# Patient Record
Sex: Female | Born: 1965 | Hispanic: Yes | Marital: Married | Smoking: Never smoker
Health system: Southern US, Community
[De-identification: ages and names within clinical notes are randomized; demographics above are authoritative.]

## PROBLEM LIST (undated history)

## (undated) DIAGNOSIS — J302 Other seasonal allergic rhinitis: Secondary | ICD-10-CM

## (undated) DIAGNOSIS — E669 Obesity, unspecified: Secondary | ICD-10-CM

## (undated) DIAGNOSIS — Z9289 Personal history of other medical treatment: Secondary | ICD-10-CM

## (undated) DIAGNOSIS — J45998 Other asthma: Secondary | ICD-10-CM

## (undated) DIAGNOSIS — D649 Anemia, unspecified: Secondary | ICD-10-CM

## (undated) DIAGNOSIS — R200 Anesthesia of skin: Secondary | ICD-10-CM

## (undated) DIAGNOSIS — R202 Paresthesia of skin: Secondary | ICD-10-CM

## (undated) HISTORY — PX: TUBAL LIGATION: SHX77

## (undated) HISTORY — DX: Anemia, unspecified: D64.9

---

## 2013-07-10 ENCOUNTER — Emergency Department (HOSPITAL_COMMUNITY)
Admission: EM | Admit: 2013-07-10 | Discharge: 2013-07-11 | Disposition: A | Discharge status: Home / Self Care | Payer: Self-pay | Attending: Emergency Medicine | Admitting: Emergency Medicine

## 2013-07-10 ENCOUNTER — Emergency Department (HOSPITAL_COMMUNITY): Payer: Self-pay

## 2013-07-10 ENCOUNTER — Other Ambulatory Visit: Payer: Self-pay

## 2013-07-10 ENCOUNTER — Encounter (HOSPITAL_COMMUNITY): Payer: Self-pay | Admitting: Emergency Medicine

## 2013-07-10 DIAGNOSIS — Z79899 Other long term (current) drug therapy: Secondary | ICD-10-CM | POA: Insufficient documentation

## 2013-07-10 DIAGNOSIS — Z3202 Encounter for pregnancy test, result negative: Secondary | ICD-10-CM | POA: Insufficient documentation

## 2013-07-10 DIAGNOSIS — J45909 Unspecified asthma, uncomplicated: Secondary | ICD-10-CM

## 2013-07-10 DIAGNOSIS — D649 Anemia, unspecified: Secondary | ICD-10-CM | POA: Insufficient documentation

## 2013-07-10 DIAGNOSIS — J45901 Unspecified asthma with (acute) exacerbation: Secondary | ICD-10-CM | POA: Insufficient documentation

## 2013-07-10 LAB — BASIC METABOLIC PANEL
BUN: 18 mg/dL (ref 6–23)
CHLORIDE: 108 meq/L (ref 96–112)
CO2: 24 mEq/L (ref 19–32)
Calcium: 8.3 mg/dL — ABNORMAL LOW (ref 8.4–10.5)
Creatinine, Ser: 0.83 mg/dL (ref 0.50–1.10)
GFR calc non Af Amer: 83 mL/min — ABNORMAL LOW (ref >90)
Glucose, Bld: 106 mg/dL — ABNORMAL HIGH (ref 70–99)
POTASSIUM: 3.7 meq/L (ref 3.7–5.3)
Sodium: 143 mEq/L (ref 137–147)

## 2013-07-10 LAB — CBC
HCT: 28.2 % — ABNORMAL LOW (ref 36.0–46.0)
Hemoglobin: 7.8 g/dL — ABNORMAL LOW (ref 12.0–15.0)
MCH: 18.6 pg — ABNORMAL LOW (ref 26.0–34.0)
MCHC: 27.7 g/dL — AB (ref 30.0–36.0)
MCV: 67.1 fL — ABNORMAL LOW (ref 78.0–100.0)
PLATELETS: ADEQUATE 10*3/uL (ref 150–400)
RBC: 4.2 MIL/uL (ref 3.87–5.11)
RDW: 21 % — AB (ref 11.5–15.5)
WBC: 6.4 10*3/uL (ref 4.0–10.5)

## 2013-07-10 LAB — I-STAT TROPONIN, ED: Troponin i, poc: 0 ng/mL (ref 0.00–0.08)

## 2013-07-10 LAB — PRO B NATRIURETIC PEPTIDE: PRO B NATRI PEPTIDE: 55.7 pg/mL (ref 0–125)

## 2013-07-10 NOTE — ED Notes (Signed)
C/o pressure in center of chest with sob, dizziness, nausea, and vomiting since 2pm.  Also reports non-productive cough for the past several days.

## 2013-07-11 LAB — POC URINE PREG, ED: Preg Test, Ur: NEGATIVE

## 2013-07-11 MED ORDER — IPRATROPIUM-ALBUTEROL 0.5-2.5 (3) MG/3ML IN SOLN
3.0000 mL | Freq: Once | RESPIRATORY_TRACT | Status: AC
  Administered 2013-07-11: 3 mL via RESPIRATORY_TRACT
  Filled 2013-07-11: qty 3

## 2013-07-11 MED ORDER — ALBUTEROL SULFATE HFA 108 (90 BASE) MCG/ACT IN AERS
2.0000 | INHALATION_SPRAY | RESPIRATORY_TRACT | Status: DC | PRN
  Administered 2013-07-11: 2 via RESPIRATORY_TRACT
  Filled 2013-07-11: qty 6.7

## 2013-07-11 MED ORDER — PREDNISONE 20 MG PO TABS
60.0000 mg | ORAL_TABLET | Freq: Once | ORAL | Status: AC
  Administered 2013-07-11: 60 mg via ORAL
  Filled 2013-07-11: qty 3

## 2013-07-11 MED ORDER — FERROUS SULFATE 325 (65 FE) MG PO TABS: 325.0000 mg | ORAL_TABLET | Freq: Two times a day (BID) | ORAL | Status: DC

## 2013-07-11 MED ORDER — PREDNISONE 20 MG PO TABS: 60.0000 mg | ORAL_TABLET | Freq: Every day | ORAL | Status: DC

## 2013-07-11 NOTE — ED Notes (Signed)
Pt feels "a little bit" better after breathing treatment. Breath sounds remain diminished with wheezing but slightly improved.

## 2013-07-11 NOTE — ED Notes (Signed)
Pregnancy test negative in mini lab.

## 2013-07-11 NOTE — Discharge Instructions (Signed)
Asma - Adultos  (Asthma, Adult)  El asma es una enfermedad que afecta los pulmones. Se caracteriza por la inflamacin y Public librarian de las vas respiratorias, as como aumento de la produccin mucosa. La causa del estrechamiento es el edema y los espasmos musculares que se producen dentro de los conductos por los que pasa el aire. Cuando esto sucede, la respiracin puede ser difcil y [place] tos, sibilancias y falta de Occupational hygienist. Aprender ms sobre su enfermedad le ayudar a Art therapist. El asma no puede curarse, pero los [employer] y los cambios en el estilo de vida lo ayudarn a Therapist, sports. Puede ser un problema menor para [employer], pero si no se controla puede conducir a un ataque potencialmente mortal. El asma puede cambiar con el [place]. Es importante trabajar con su mdico para controlar sus sntomas. CAUSAS  La causa exacta es desconocida. Se cree que la causa son factores heredados (genticosy la exposicin a Secondary school teacher. En el asma hay irritacin e hinchazn (inflamacin) de las vas respiratorias. Puede originarse debido a Set designer, infecciones virales del pulmn o irritantes presentes en el aire. Las Chief of Staff pueden causar sibilancias inmediatamente o varias horas despus de una exposicin. Los desencadenantes del asma son diferentes para cada persona. Es Landscape architect atencin y saber qu lo desencadena.  Algunos desencadenantes comunes para los ataques de asma son:   La caspa que eliminan los animales de la piel, el pelo o las plumas de [place].  Los caros del polvo que se encuentran en el polvo de la casa.  [place].  El plen de los rboles o el csped.  El moho.  El humo del cigarrillo o del tabaco. NO DEBE PERMITIRSE QUE SE FUME EN LOS HOGARES DE PERSONAS CON ASMA. Las Acupuncturist no deben fumar y no deben Agricultural consultant de fumadores.  Sustancias contaminantes como el polvo, limpiadores hogareos, sprays para el cabello,  aerosoles, vapores de [place], sustancias qumicas fuertes u olores intensos.  El aire fro o los cambios climticos. El aire fro puede causar inflamacin. El viento aumenta la cantidad de moho y polen del aire. No hay ningn clima particular que sea el mejor para un asmtico.  Emociones fuertes, como llorar o reir intensamente.  Estrs.  Ciertos medicamentos como la aspirina o los betabloqueantes.  Los sulfitos que se encuentran en medicamentos y bebidas como frutas desecadas y el vino.  Enfermedades infecciosas o inflamatorias como la gripe, el resfro o una inflamacin de las membranas nasales (rinitis).  Reflujo gastroesofgico (ERGE). El ERGE es una enfermedad del estmago en el que los cidos vuelven a la garganta (esfago).  Ejercicios o actividad extenuante. Algunos medicamentos administrados antes del ejercicio permiten que la mayora de las personas puedan participar en los deportes. SNTOMAS   Falta de aire.  Opresin o dolor en el pecho.  Dificultad para dormir debido a la tos, sibilancias o falta de aire.  Un silbido o sibilancias con la espiracin.  Tos o sibilancias que empeoran cuando usted:  Tiene un virus (como un resfriado o gripe).  Sufre de Set designer.  Est expuesto a ciertos vapores o sustancias qumicas.  La prctica de ejercicios. Los signos de que su asma est empeorando son:   Signos y sntomas de asma ms frecuentes y molestos.  Aumenta la dificultad para respirar. Esto puede medirse con un medidor de flujo mximo, que es un dispositivo simple que se [place] para comprobar como estn funcionando los pulmones.  La necesidad cada vez ms frecuente de Chief Strategy Officer  de alivio rpido. DIAGNSTICO  El diagnstico se realiza revisando su historia clnica a travs de un examen fsico y con [employer]. Algunos estudios de la funcin pulmonar pueden ayudar con el diagnstico.  TRATAMIENTO  El asma no se cura. Sin embargo, en la [employer] de los  adultos puede controlarse con [place]. Adems de evitar los desencadenantes, ser necesario que use medicamentos. Hay 2 tipos de medicamento que se utilizan en el tratamiento para el asma: los medicamentos controladores (reducen la inflamacin y los sntomas) y los medicamentos de Art gallery manager o de rescate ( alivian los sntomas durante los ataques agudos). Es posible que necesite medicamentos todos los das para controlar el asma. Los medicamentos controladores ms eficaces para el asma son los corticoides inhalados (reducen la inflamacin). Otros medicamentos de control a largo plazo incluyen:   Antagonistas de los receptores de leucotrienos (bloquea una va de inflamacin).  Beta2-agonistas de accin prolongada (relajan los msculos de las vas respiratorias durante al menos 12 horas) con un corticoides inhalado.  El cromoglicato o nedocromil sdico (modifica la capacidad de ciertas clulas inflamatorias para liberar los productos qumicos que causa inflamacin).  Los inmunomoduladores (alteran el sistema inmunolgico para prevenir los sntomas del asma).  La teofilina (relaja los msculos de las vas respiratorias). Puede ser posible que necesite beta2-agonistas de corta accin para [employer] sntomas del asma durante un ataque agudo.  Usted debe saber qu hacer durante un ataque agudo. Los inhalantes son eficaces cuando se Naval architect. Lea las instrucciones para saber Clinical research associate, y hable con su mdico si tiene dudas. Concurra a los controles regularmente para asegurarse que el asma est bien controlado. Si no est bien controlado, si ha sido hospitalizado por asma o si se necesitan mltiples medicamentos o dosis medias a altas de corticoides inhalados para controlarlo, pida que lo deriven a un especialista en asma.  [place] todos los medicamentos segn le indic su mdico.  Controle el ambiente hogareo en los  siguientes modos para prevenir los ataques de asma:  Cambie el filtro de la calefaccin y del aire acondicionado al menos una vez al mes.  Coloque un filtro o estopa sobre la ventilacin de la calefaccin o del aire acondicionado.  Limite el uso de hogares o estufas a lea.  No fume. No permanezca en lugares en los que otras personas fuman.  Elimine las plagas (cucarachas, ratones) y sus excrementos.  Si observa moho en una planta, elimnela.  Limpie los pisos y elimine el polvo una vez por semana. Utilice productos sin perfume. Utilice una aspiradora con filtros HEPA, siempre que le sea posible. Si la aspiradora o las tareas de limpieza son los desencadenantes de su asma, trate de encontrar a otra persona para hacer estas tareas.  Los pisos de su casa deben ser de [place], [place] o vinilo. Las alfombras pueden retener la caspa y [place].  Use almohadas, mantas y cubre colchones antialrgicos.  [place] sbanas y [place] todas las semanas con agua caliente, y squelas con Freight forwarder.  Use mantas de polister o algodn de tejido apretado.  No use cobertores Environmental manager.  Limpie el bao y la cocina con lavandina y pntelos con pintura antihongos.  Lvese las manos con frecuencia.  Converse con el mdico acerca del plan de accin para controlar los ataques de asma. Incluye el uso de un medidor de flujo, que mide la gravedad del ataque, y medicamentos que ayuden a Architectural technologist  ataque. Un plan de accin que lo ayude a minimizar o [employer] ataques sin necesidad de Sales executive atencin mdica.  Mantenga la calma durante el ataque.  Cuente siempre con un plan para solicitar atencin mdica. Debe incluir la forma de contactar a su mdico y, en caso de un ataque grave, comunicarse con el servicio de Multimedia programmer de su localidad (911 en los [place]). SOLICITE ATENCIN MDICA SI:   Respira con dificultad an cuando toma la medicina para prevenir los ataques.  Elimina flema cada vez ms  espesa.  Su esputo cambia de un color claro o blanco a un color amarillo, verde, gris o sanguinolento.  Tiene trastornos ocasionados por la medicina que est tomando (como urticaria, picazn, hinchazn o dificultades respiratorias).  Necesita un medicamento aliviador ms de 2 o 3 veces por semana.  Su flujo mximo an est en 50-79% del Pharmacist, hospital personal despus de seguir el plan de accin durante 1 hora. SOLICITE ATENCIN MDICA DE INMEDIATO SI:   Le falta el aire, incluso en reposo.  Le falta el aire an cuando hace muy poca actividad fsica.  Tiene dificultad para comer, beber o hablar debido a los sntomas del asma.  Siente dolor en el pecho o el corazn palpita muy rpido.  Tiene los labios o las uas de tono [place].  Usted se siente mareado, dbil o se desmaya.  Tiene fiebre o sntomas que persisten durante ms de 2 o 3 das.  Tiene fiebre y los sntomas empeoran de manera sbita.  Usted parece empeorar y no responde al tratamiento durante un ataque de asma.  Su flujo mximo es Garment/textile technologist del 50% del Pharmacist, hospital personal. ASEGRESE DE QUE:   Comprende estas instrucciones.  Controlar su enfermedad.  Solicitar ayuda de inmediato si no mejora o si empeora. Document Released: 02/17/2005 Document Revised: 02/04/2012 [facility] Patient Information 2014 [place], [place].  Albuterol inhalation aerosol Qu es este medicamento? El ALBUTEROL es un broncodilatador. Ayuda a abrir las vas areas a los pulmones y Field seismologist respirar ms fcilmente. Este medicamento es utilizado para tratar y Engineer, maintenance broncoespasmo. Este medicamento puede ser utilizado para otros usos; si tiene alguna pregunta consulte con su proveedor de atencin mdica o con su farmacutico. MARCAS COMERCIALES DISPONIBLES: Proair HFA, Proventil HFA, Proventil, Respirol , Ventolin HFA, Ventolin Qu le debo informar a mi profesional de la salud antes de tomar este medicamento? Necesita saber si usted presenta  alguno de los siguientes problemas o situaciones: -diabetes -enfermedad cardiaca o pulso cardiaco irregular -alta presin sangunea -feocromocitoma -convulsiones -enfermedad tiroidea -una reaccin alrgica o inusual al albuterol, al levalbuterol, a los sulfitos, a otros medicamentos, alimentos, colorantes o conservadores -si est embarazada o buscando quedar embarazada -si est amamantando a un beb Cmo debo utilizar este medicamento? Este medicamento es para inhalacin por va oral. Siga las instrucciones de la etiqueta del [place]. Utilcelo a intervalos regulares. No utilice su medicamento con una frecuencia mayor a la indicada. Asegrese de que est utilizando su [place]. Si tiene Medtronic, comunquese con su mdico o su proveedor de Geophysical data processor. Hable con su pediatra para informarse acerca del uso de este medicamento en nios. Puede requerir atencin especial. Sobredosis: Pngase en contacto inmediatamente con un centro toxicolgico o una sala de urgencia si usted cree que haya tomado demasiado medicamento. ATENCIN: [employer] Foods es solo para usted. No comparta este medicamento con nadie. Qu sucede si me olvido de una dosis? Si olvida una dosis, sela lo antes posible. Si es casi la [employer]  de su dosis siguiente, aplique slo esa dosis. No use dosis dobles o adicionales. Qu puede interactuar con este medicamento? -antiinfecciosos como cloroquina y pentamidina -cafena -cisapride -diurticos -medicamentos para resfros -medicamentos para tratar la depresin o trastornos psicticos o emocionales -medicamentos para bajar de peso incluyendo algunos productos a base de hierbas -metadona -ciertos antibiticos Estate manager/land agent, eritromicina, levofloxacino y linezolid -ciertos medicamentos cardiacos -hormonas esteroideas, tales como dexametasona, cortisona, hidrocortisona -teofilina -hormonas tiroideas Puede ser que esta lista no menciona  todas las posibles interacciones. Informe a su profesional de KB Home	[place] de AES Corporation productos a base de hierbas, medicamentos de [place] o suplementos nutritivos que est tomando. Si usted fuma, consume bebidas alcohlicas o si utiliza drogas ilegales, indqueselo tambin a su profesional de KB Home	[place]. Algunas sustancias pueden interactuar con su medicamento. A qu debo estar atento al usar Coca-Cola? Informe a su mdico o a su profesional de la salud si sus sntomas no mejoran. No utilice albuterol adicional. Si su asma o bronquitis empeora mientras est recibiendo Coca-Cola, comunquese inmediatamente con su mdico. Si el medicamento le seca la boca trate de Engineer, manufacturing systems chicle sin azcar o chupar caramelos duros. Bebe agua como le haya indicado. Qu efectos secundarios puedo tener al Masco Corporation este medicamento? Efectos secundarios que debe informar a su mdico o a Barrister's clerk de la salud tan pronto como sea posible: -Chief of Staff como erupcin cutnea, picazn o urticarias, hinchazn de la cara, labios o lengua -problemas respiratorios -dolor en el pecho -sensacin de desmayos o mareos, cadas -alta presin sangunea -pulso cardiaco irregular -fiebre -calambres o debilidad muscular -dolor, hormigueo, entumecimiento de las manos o pies -vmito Efectos secundarios que, por lo general, no requieren atencin mdica (debe informarlos a su mdico o a su profesional de la salud si persisten o si son molestos): -tos -dificultad para conciliar el sueo -dolor de cabeza -nerviosismo, temblores -Higher education careers adviser -congestin nasal o goteo de la Lawyer -irrtacin de garganta -sabor inusual Puede ser que esta lista no menciona todos los posibles efectos secundarios. Comunquese a su mdico por asesoramiento mdico Humana Inc. Usted puede informar los efectos secundarios a la FDA por telfono al 1-800-FDA-1088. Dnde debo guardar mi medicina? Mantngala  fuera del alcance de los nios. Guarde a Levi Strauss 15 y 55 grados C (80 y 91 grados F). El contenido se encuentra bajo presin y puede explotar si lo expone al calor o al fuego. No lo congele. El fro Exelon Corporation efectividad del [place]. Deseche todo el medicamento que no haya utilizado, despus de la fecha de vencimiento. Debe desechar los inhaladores despus de que se han usado la cantidad de bocanadas indicado en la etiqueta o por la fecha de vencimiento; el que llegue primero. Debe desechar Ventolin HFA despus de 12 meses de sacar de la bolsa de aluminio. Revise las instrucciones que vienen con su medicamento. ATENCIN: Este folleto es un resumen. Puede ser que no cubra toda la posible informacin. Si usted tiene preguntas acerca de esta medicina, consulte con su mdico, su farmacutico o su profesional de Technical sales engineer.  2014, Elsevier/Gold Standard. (2012-09-14 15:41:57)  Prednisone tablets Qu es este medicamento? La PREDNISONA es un corticosteroide. Se utiliza para tratar la inflamacin de la piel, articulaciones, pulmones y otros rganos. Condiciones comunes tratadas incluyen asma, alergias y artritis. Tambin se [place] para [employer]ak, tales como trastornos sanguneos o enfermedades de la glndula suprarrenal. Este medicamento puede ser utilizado para otros usos; si tiene alguna pregunta consulte con su proveedor de  atencin mdica o con su farmacutico. MARCAS COMERCIALES DISPONIBLES: Deltasone, Predone, Sterapred DS, Sterapred Qu le debo informar a mi profesional de la salud antes de tomar este medicamento? Necesita saber si usted presenta alguno de los siguientes problemas o situaciones: -Sndrome de [place] -diabetes -glaucoma -enfermedad cardiaca -alta presin sangunea -infeccin (especialmente infecciones virales, como varicela o herpes) -enfermedad renal -enfermedad heptica -problemas mentales -miastenia  gravis -osteoporosis -convulsiones -problemas estomacales o intestinales -enfermedad tiroidea -una reaccin alrgica o inusual a la lactosa, a la prednisona, a otros medicamentos, alimentos, colorantes o conservantes -si est embarazada o buscando quedar embarazada -si est amamantando a un beb Cmo debo utilizar este medicamento? Tome este medicamento por va oral con un vaso de agua. Siga las instrucciones de la etiqueta del [place]. Tome este medicamento con alimentos. Si slo toma este medicamento una vez al da, tmelo por la [place]. No tome ms medicamento que lo indicado. No deje de tomar este medicamento de manera abrupta debido a que podr Engineer, materials reaccin severa. Su mdico le indicar la cantidad de Event organiser. Si su mdico desea que [employer], la dosis ser reducida gradualmente para Research officer, political party secundarios. Hable con su pediatra para informarse acerca del uso de este medicamento en nios. Puede requerir atencin especial. Sobredosis: Pngase en contacto inmediatamente con un centro toxicolgico o una sala de urgencia si usted cree que haya tomado demasiado medicamento. ATENCIN: ConAgra Foods es solo para usted. No comparta este medicamento con nadie. Qu sucede si me olvido de una dosis? Si olvida una dosis, tmela tan pronto como sea posible. Si es casi la hora de su dosis siguiente, consulte a su mdico o a su profesional de Technical sales engineer. Usted puede necesitar saltar una dosis o tomar una dosis adicional. No tome dosis dobles o adicionales sin asesoramiento. Qu puede interactuar con este medicamento? No tome esta medicina con ninguno de los siguientes medicamentos: -metirapona -mifepristona Esta medicina tambin puede interactuar con los siguientes medicamentos: -aminoglutetimida -anfotericina B -aspirina o medicamentos tipo aspirina -barbitricos -ciertos medicamentos para la diabetes, tales como glipizida o  gliburida -colestiramina -inhibidores de colinesterasa -ciclosporina -digoxina -diurticos -efedrina -hormonas femeninas, como estrgenos, progestinas o pldoras anticonceptivas -isoniazida -quetoconazol -los Speed, medicamentos para el dolor o inflamacin, como ibuprofeno o naproxeno -fenitona -rifampicina -toxoide -vacunas -warfarina Puede ser que esta lista no menciona todas las posibles interacciones. Informe a su profesional de KB Home	[place] de AES Corporation productos a base de hierbas, medicamentos de [place] o suplementos nutritivos que est tomando. Si usted fuma, consume bebidas alcohlicas o si utiliza drogas ilegales, indqueselo tambin a su profesional de KB Home	[place]. Algunas sustancias pueden interactuar con su medicamento. A qu debo estar atento al usar Coca-Cola? Visite a su mdico o a su profesional de la salud para chequear su evolucin peridicamente. Si toma este medicamento durante un perodo prolongado, lleve consigo una tarjeta de identificacin con su nombre y direccin, el tipo y la dosis del [place], y Academic librarian y la direccin de su mdico. [place] medicamento puede aumentar su riesgo de contraer una infeccin. Informe a su mdico o a su profesional de la salud si est en contacto con personas con sarampin o varicela, o si desarrolla llagas o ampollas que no se curan bien. Si va a someterse a una operacin, informe a su mdico o a su profesional de la salud que ha tomado este [employer] ltimos doce meses. Consulte a su mdico o a su profesional de la salud acerca de su dieta.  Tal vez tendr que reducir la cantidad de sal que consume. Este medicamento puede afectar su nivel de Dispensing optician. Si tiene diabetes, consulte a su mdico o a su profesional de la salud antes de cambiar su dieta o la dosis de su medicamento para la diabetes. Qu efectos secundarios puedo tener al Masco Corporation este medicamento? Efectos secundarios que debe informar a su mdico o  a Barrister's clerk de la salud tan pronto como sea posible: -Chief of Staff como erupcin cutnea, picazn o urticarias, hinchazn de la cara, labios o lengua -cambios de emociones o humor -cambios en la visin -humor deprimido -dolor ocular -fiebre o escalofros, tos, dolor de garganta, dolor o dificultad para orinar -aumento de sed -hinchazn de tobillos, pies Efectos secundarios que, por lo general, no requieren atencin mdica (debe informarlos a su mdico o a su profesional de la salud si persisten o si son molestos): -confusin, excitacin, inquietud -dolor de cabeza -nuseas, vmito -problemas en la piel, acn, piel delgada y brillante -dificultad para conciliar el sueo -aumento de peso Puede ser que esta lista no menciona todos los posibles efectos secundarios. Comunquese a su mdico por asesoramiento mdico Humana Inc. Usted puede informar los efectos secundarios a la FDA por telfono al 1-800-FDA-1088. Dnde debo guardar mi medicina? Mantngala fuera del alcance de los nios. Gurdela a [employer] Group, entre 15 y 43 grados C (79 y 93 grados F). Protejla de la luz. Mantenga el envase bien cerrado. Deseche los medicamentos que no haya utilizado, despus de la fecha de vencimiento. ATENCIN: Este folleto es un resumen. Puede ser que no cubra toda la posible informacin. Si usted tiene preguntas acerca de esta medicina, consulte con su mdico, su farmacutico o su profesional de Technical sales engineer.  2014, Elsevier/Gold Standard. (2010-11-06 16:57:34)  Anemia inespecfica (Anemia, Nonspecific) La anemia es una enfermedad en la que la concentracin de glbulos rojos o el nivel de hemoglobina en la sangre estn por debajo de lo normal. La hemoglobina es la sustancia de los glbulos rojos que lleva el oxgeno a todo el cuerpo. La anemia da como resultado que los tejidos no reciban la cantidad suficiente de oxgeno.  CAUSAS  Las causas ms frecuentes de anemia son:    Elvina Mattes. El sangrado puede ser interno o externo. Incluye sangrado excesivo debido al perodo (en las mujeres) o por los intestinos.   Dficit nutricional.   Enfermedad renal, tiroidea o heptica crnicas.  Enfermedades de la mdula sea que disminuyen la produccin de glbulos rojos.  Cncer y tratamientos para Science writer.  VIH, sida y sus tratamientos.  Trastornos del bazo que aumentan la destruccin de glbulos rojos.  Enfermedades de [employer].  Destruccin excesiva de glbulos rojos debido a una infeccin, a medicamentos y a Nurse, mental health. SIGNOS Y SNTOMAS   Debilidad leve.   Mareos.   Dolor de [place].  Palpitaciones.   Falta de aire, especialmente con el ejercicio.   Palidez.  Sensibilidad al fro.  Indigestin.  Nuseas.  Dificultad para dormir.  Dificultad para concentrarse. Los sntomas pueden ocurrir repentinamente o pueden Psychologist, forensic.  DIAGNSTICO  Con frecuencia es necesario realizar anlisis de [employer]. Estos ayudan al profesional a Adult nurse. Su mdico controlar la materia fecal para Hydrographic surveyor la presencia de [place] y buscar otras causas de prdida de [place].  TRATAMIENTO  El tratamiento vara segn la causa de la anemia. Las opciones de tratamiento son:   Suplementos de hierro, vitamina H84, o cido flico.   Medicamentos  con hormonas.   Transfusin de [place]. Ser necesaria en los casos de prdida de [place] grave.   Hospitalizacin. Ser necesaria si la prdida de sangre es continua y significativa.   Cambios en la dieta.  Extirpacin del bazo. INSTRUCCIONES PARA EL CUIDADO EN EL HOGAR Cumpla con todas las visitas de control. Generalmente demora varias semanas corregir la anemia, y es muy importante que el mdico controle su enfermedad y su respuesta al [place]. SOLICITE ATENCIN MDICA DE INMEDIATO SI:   Siente debilidad extrema, falta de aire o dolor en  el pecho.   Se siente mareado o tiene dificultad para concentrarse.  Tiene una hemorragia vaginal abundante.   Aparece una erupcin cutnea.   La materia fecal es negra, de aspecto alquitranado.   Se desmaya.   Vomita sangre.   Vomita repetidas veces.   Siente dolor abdominal.  Tiene fiebre o sntomas persistentes durante ms de 2 - 3 das.   Tiene fiebre y los sntomas empeoran repentinamente.   Se deshidrata.  ASEGRESE DE QUE:  Comprende estas instrucciones.  Controlar su afeccin.  Recibir ayuda de inmediato si no mejora o si empeora. Document Released: 02/17/2005 Document Revised: 10/20/2012 [facility] Patient Information 2014 [place], [place].  Iron tablets, capsules, extended-release tablets Qu es este medicamento? Las SALES DE HIERRO reemplazan al hierro que es imprescindible para los glbulos rojos saludables. El hierro se [place] para tratar la anemia por deficiencia de hierro. La anemia puede causar problemas tales como, cansancio, falta de aliento o crecimiento retardado en los nios. Slo debe tomar hierro adicional si as lo indica su mdico. No se trate usted mismo con hierro si se siente cansado. La State Farm de las personas sanas absorben cantidades [place] de hierro de su dieta, especialmente si consumen cereales fortificados, carne roja, carne de ave y pescado. Este medicamento puede ser utilizado para otros usos; si tiene alguna pregunta consulte con su proveedor de atencin mdica o con su farmacutico. MARCAS COMERCIALES DISPONIBLES: Georgann Housekeeper, Duofer, Feosol Complete, WESCO International, Feosol, [place], [place] , [place], Fero-Grad 500, Ferretts, Ferrimin , Ferro-Sequels, Hemocyte, Nephro-Fer, NovaFerrum, ProFe, Slow Fe, Slow Iron , Tandem Qu le debo informar a mi profesional de la salud antes de tomar este medicamento? Necesita saber si usted presenta alguno de los siguientes problemas o situaciones: -si consume bebidas alcohlicas con  frecuencia -enfermedad intestinal -anemia hemoltica -exceso de hierro (hemocromatosis, hemosiderosis) -enfermedad heptica -problemas para tragar -lcera gstrica u otros problemas estomacales -una reaccin alrgica o inusual al hierro, otros medicamentos, alimentos, colorantes o conservantes -si est embarazada o buscando quedar embarazada -si est amamantando a un beb Cmo debo utilizar este medicamento? Celanese Corporation medicamento por va oral con un vaso de agua o jugo de frutas. Siga las instrucciones de la etiqueta del [place]. Ingerir entero. No lo triture ni mastique. Tome este medicamento mientras est sentado o en posicin erguida. Trate de tomar las dosis que corresponden a la hora de acostarse al menos 10 minutos antes de [place]. Puede tomar este medicamento con alimentos. Tome sus dosis a intervalos regulares. No tome su medicamento con una frecuencia mayor a la indicada. No deje de tomarlo excepto si as lo indica su mdico. Hable con su pediatra para informarse acerca del uso de este medicamento en nios. Aunque este medicamento se puede recetar para condiciones selectivas, las precauciones se aplican. Sobredosis: Pngase en contacto inmediatamente con un centro toxicolgico o una sala de urgencia si usted cree que haya tomado demasiado medicamento. ATENCIN: ConAgra Foods es solo para usted. No comparta  este medicamento con nadie. Qu sucede si me olvido de una dosis? Si olvida una dosis, tmela lo antes posible. Si es casi la hora de la prxima dosis, tome slo esa dosis. No tome dosis adicionales o dobles. Qu puede interactuar con este medicamento? Si est tomando este producto con hierro no Conservator, museum/gallery o suplemento diettico que IT sales professional. Esta medicina tambin puede interactuar con los siguientes medicamentos: -alendronato -anticidos -cefdinir -cloranfenicol -colestiramina -deferoxamina -dimercaprol -etidronato -medicamentos para  lceras u otros problemas estomacales -suplementos con enzimas pancreticas -antibiticos quinolnicos (por ejemplo: Cipro, Floxin, Levaquin, Tequin y otros) -risedronato -antibiticos tetraciclnicos (por ejemplo: doxiciclina, Nature conservation officer, minociclina y otros) -hormonas tiroideas Puede ser que esta lista no menciona todas las posibles interacciones. Informe a su profesional de KB Home	[place] de [employer] Corporation productos a base de hierbas, medicamentos de [place] o suplementos nutritivos que est tomando. Si usted fuma, consume bebidas alcohlicas o si utiliza drogas ilegales, indqueselo tambin a su profesional de KB Home	[place]. Algunas sustancias pueden interactuar con su medicamento. A qu debo estar atento al usar Coca-Cola? Los suplementos con hierro slo se deben Risk manager como indicado por su profesional de la salud. Necesitar realizarse anlisis de sangre mientras est tomando [place]. Pueden necesitar entre 3 y 6 meses de terapia con hierro para tratar L-3 Communications de hierro. Las mujeres embarazadas deben seguir las indicaciones de sus mdicos acerca de la dosis y la duracin del tratamiento con hierro. No utilice el hierro durante un perodo de tiempo mayor que el indicado y no tome una dosis mayor que la recomendada. La utilizacin prolongada puede causar un exceso de hierro en el cuerpo. No tome el hierro con anticidos. Si necesita tomar un anticido, hgalo 2 horas despus de una dosis de hierro. Qu efectos secundarios puedo tener al Masco Corporation este medicamento? Efectos secundarios que debe informar a su mdico o a Barrister's clerk de la salud tan pronto como sea posible: -Chief of Staff como erupcin cutnea, picazn o urticarias, hinchazn de la cara, labios o lengua -labios, uas o palmas de las manos azules -heces de color oscuro (esto puede deberse a la decoloracin causada por el hierro, pero puede ser indicativo de un problema ms grave) -somnolencia -dolor o  dificultad al tragar -piel plida o sudorosa -convulsiones -dolor estomacal -cansancio o debilidad inusual -vmito -pulso cardiaco dbil, rpido o irregular Efectos secundarios que, por lo general, no requieren atencin mdica (debe informarlos a su mdico o a su profesional de la salud si persisten o si son molestos): -estreimiento -indigestin -nuseas o Higher education careers adviser Puede ser que esta lista no menciona todos los posibles efectos secundarios. Comunquese a su mdico por asesoramiento mdico Humana Inc. Usted puede informar los efectos secundarios a la FDA por telfono al 1-800-FDA-1088. Dnde debo guardar mi medicina? Mantngala fuera del alcance de los nios. Aun las cantidades pequeas de hierro pueden ser nocivas para los nios. Gurdela a FPL Group, entre 15 y 2 grados C (71 y 66 grados F). Mantenga el envase bien cerrado. Deseche los medicamentos que no haya utilizado, despus de la fecha de vencimiento. ATENCIN: Este folleto es un resumen. Puede ser que no cubra toda la posible informacin. Si usted tiene preguntas acerca de esta medicina, consulte con su mdico, su farmacutico o su profesional de Technical sales engineer.  2014, Elsevier/Gold Standard. (2006-02-05 15:14:00)   Emergency Department Resource Guide 1) Find a Doctor and Pay Out of Pocket Although you won't have to find out who is covered by your insurance  plan, it is a good idea to ask around and get recommendations. You will then need to call the office and see if the doctor you have chosen will accept you as a new patient and what types of options they offer for patients who are self-pay. Some doctors offer discounts or will set up payment plans for their patients who do not have insurance, but you will need to ask so you aren't surprised when you get to your appointment.  2) Contact Your Local Health Department Not all health departments have doctors that can see patients for sick visits, but  many do, so it is worth a call to see if yours does. If you don't know where your local health department is, you can check in your phone book. The CDC also has a tool to help you locate your state's health department, and many state websites also have listings of all of their local health departments.  3) Find a [facility] If your illness is not likely to be very severe or complicated, you may want to try a walk in clinic. These are popping up all over the country in pharmacies, drugstores, and shopping centers. They're usually staffed by nurse practitioners or physician assistants that have been trained to treat common illnesses and complaints. They're usually fairly quick and inexpensive. However, if you have serious medical issues or chronic medical problems, these are probably not your best option.  No Primary Care Doctor: - Call Health Connect at  [phone] - they can help you locate a primary care doctor that  accepts your insurance, provides certain services, etc. - Physician Referral Service- [phone]  Chronic Pain Problems: Organization         Address  Phone   Notes  [facility]  [phone] Patients need to be referred by their primary care doctor.   Medication Assistance: Organization         Address  Phone   Notes  [facility] Medication [facility] [address] [place], [place] [address] [phone] --Must be a resident of [facility] -- Must have NO insurance coverage whatsoever (no Medicaid/ Medicare, etc.) -- The pt. MUST have a primary care doctor that directs their care regularly and follows them in the community   MedAssist  [phone]   [employer]  [phone]    Agencies that provide inexpensive medical care: Organization         Address  Phone   Notes  [place]  [phone]   [facility]    [phone]   [facility] [place], [place] [address] [phone]   [place] [address] [place] [phone]   Planned Parenthood    [phone]   [facility]    [phone]   [place] and [place] [address], [place] Phone:  [phone], Fax:  [phone] Hours of Operation:  9 am - 6 pm, M-F.  Also accepts Medicaid/Medicare and self-pay.  [facility] for [place], [address], [place] Phone: [phone], Fax: [phone]. Hours of Operation:  8:30 am - 5:30 pm, M-F.  Also accepts Medicaid and self-pay.  [facility] [place] [address] [place] Phone: [phone]   [place], [place], [place] [phone], Ext. [phone] Mondays & Thursdays:  7-9 AM.  First 15 patients are seen on a first come, first serve basis.    [place] Providers:  Organization         Address  Phone   Notes  [facility] [address], [place] [phone] Also accepts self-pay patients.  [facility] [address], [place]  [phone]   [place], [address], [place] [phone]   [facility] [address] [place] [phone]   Lucianne Lei [address], [place]   [phone] Only accepts [place] Access [place] patients after they have their name applied to their card.   Self-Pay (no insurance) in [facility]:  Organization         Address  Phone   Notes  Sickle Cell Patients, [facility] Internal Medicine [place] [phone]   [facility] Urgent Care [place] [phone]   [facility] [place]  [place], [place], [place] [phone]   [facility]/Dr. Osei-Bonsu  [address] [place] or  [address], [place] [phone] Phone number for both [place] and [place] locations is the same.  Urgent Medical and [facility] [address] [place] [phone]   [facility] [address], [place] or [address] [phone]   [facility] [address] [place] [phone], phone; [phone], fax Sees patients 1st and 3rd Saturday of every month.  Must not qualify for public or private insurance (i.e. Medicaid, Medicare, [place] Health Choice, Veterans' Benefits)  Household income should be no more than 200% of the poverty level The clinic cannot treat you if you are pregnant or think you are pregnant  Sexually transmitted diseases are not treated at the clinic.    Dental Care: Organization         Address  Phone  Notes  [facility] Department of [facility] [place] [phone] Accepts children up to age 68 who are enrolled in [place] or [place]; pregnant women with a Medicaid card; and children who have applied for Medicaid or [place] Health Choice, but were declined, whose parents can pay a reduced fee at time of service.  [facility] Department of [facility]  [address], [place] [phone] Accepts children up to age 58 who are enrolled in [place] or [place]; pregnant women with a Medicaid card; and children who have applied for Medicaid or [place] Health Choice, but were declined, whose parents can pay a reduced fee at time of service.  [place] Adult Dental Access PROGRAM  [place] [phone] Patients are seen by appointment only. Walk-ins are not accepted. [place] will see patients 66 years of age and older. Monday - Tuesday (8am-5pm) Most Wednesdays (8:30-5pm) $30 per visit, cash only  [facility] Adult Dental Access PROGRAM  [address], [facility] [phone] Patients are seen by appointment only. Walk-ins are not accepted. [place] will see patients 73 years of age and older. One Wednesday Evening (Monthly: Volunteer Based).  $30 per visit, cash only  [place]  [phone] for adults; Children under age  4, call Graduate Pediatric Dentistry at [phone]. Children aged 43-14, please call [phone] to request a pediatric application.  Dental services are provided in all areas of dental care including fillings, crowns and bridges, complete and partial dentures, implants, gum treatment, root canals, and extractions. Preventive care is also provided. Treatment is provided to both adults and children. Patients are selected via a lottery and there is often a waiting list.   [facility] [address] [place]  [phone] [email or web address]   [facility] [address] [place], [place] [phone], Ext. [phone] Second and Fourth Thursday of each month, opens at 6:30 AM; Clinic ends at 9 AM.  Patients are seen on a first-come first-served basis, and a limited number are seen during each clinic.   [facility]  [address] Hillard Danker [place] , [place] [phone]   Eligibility Requirements You must have lived in [place], [place], or [place] counties for at least the last three months.   You cannot be eligible for state or federal sponsored Apache Corporation, including [employer], [place], or [employer].   You generally cannot be eligible for healthcare insurance through your employer.    How to apply: Eligibility screenings are held every Tuesday and Wednesday afternoon from 1:00 pm until 4:00 pm. You do not need an appointment for the interview!  [facility] [address] [place], [place]   [place]  [place] Department  [place]  [phone]    Behavioral Health Resources in the Community: Intensive Outpatient Programs Organization         Address  Phone  Notes  [place]. [address], [place], [place] [phone]   [facility] Outpatient [address] [place], [place]   ADS: Alcohol & Drug Svcs [address] [place], [place]   [place] [address],  [place], [place] or [phone]   Substance Abuse Resources Organization         Address  Phone  Notes  Alcohol and Drug Services  [phone]   [place]  [phone]   The [place]   Chinita Pester  [phone]   Residential & Outpatient Substance Abuse Program  [phone]   Psychological Services Organization         Address  Phone  Notes  [facility] [place]  [place]  [phone]   [place] [address], [place] or [phone]    Mobile Crisis Teams Organization         Address  Phone  Notes  [facility], Mobile Crisis Care Unit  [phone]   [facility]ive Psychotherapeutic Services  [address] [place], [place]   [place] [address] [place] [phone]    Self-Help/Support Groups Organization         Address  Phone             Notes  [place]. of [place] - variety of support groups  [place] Call for more information  Narcotics Anonymous (NA), [facility] [address], [employer] [place]  2 meetings at this location   Special educational needs teacher         Address  Phone  Notes  [facility] [place],  [place]  [place]  [address] [place] 017793, [place], [place]   [place], [place] [phone]  Admissions: 8am-3pm M-F  Incentives Substance [place] [address]    [place], [place] [phone]   [facility] [address] [place], [place], [place]   [facility] [address]  [place], [place]   [facility] Programs - Intensive Outpatient [address]een [address], [place], [place]   [facility] ([place].,  [place], [place] [phone] or [phone]   [facility]idential Treatment Services (RTS) [address] [place], [place] Accepts Medicaid  [facility] [place] [address],  [place] [phone] Substance Abuse/Addiction Treatment   [facility] Organization         Address  Phone  Notes  [facility]  [phone]   Domenic Schwab, PhD [address] [place]lis [place], [place]   [phone] or [phone]   [place], [place] [phone]   [facility] [address] [place], [place] [phone] Insurance/Medicaid/sponsorship through [facility] and Families [address] [place] [place]                                    [place], [place] [phone] [place] [address], [place] [phone]    Dr. Adele Schilder  [phone]   [facility] [place] Dept. 1) [address], [place] 2) [place] 3)  [place] [address], [place] [phone]  [phone]   [place] [phone] or [phone] (After Hours)

## 2013-07-11 NOTE — ED Provider Notes (Signed)
CSN: [id number]     Arrival date & time 07/10/13  2233 History   First MD Initiated Contact with Patient 07/11/13 0035     Chief Complaint  Patient presents with  . Chest Pain     (Consider location/radiation/quality/duration/timing/severity/associated sxs/prior Treatment) Patient is a 48 y.o. female presenting with chest pain. The history is provided by the patient.  Chest Pain She complains of tight feeling in her chest and dyspnea throughout the day today. Disc is worse if she lays flat her she exerts herself. There is an associated nonproductive cough and nausea when she coughs. She denies fever, chills, sweats. She denies arthralgias or myalgias. She has not done anything to try to treat or chest symptoms. She is a nonsmoker and has no history of hypertension or diabetes or hyperlipidemia. She does have history of seasonal allergies.  History reviewed. No pertinent past medical history. History reviewed. No pertinent past surgical history. No family history on file. History  Substance Use Topics  . Smoking status: Never Smoker   . Smokeless tobacco: Not on file  . Alcohol Use: No   OB History   Grav Para Term Preterm Abortions TAB SAB Ect Mult Living                 Review of Systems  Cardiovascular: Positive for chest pain.  All other systems reviewed and are negative.     Allergies  Review of patient's allergies indicates no known allergies.  Home Medications   Prior to Admission medications   Medication Sig Start Date End Date Taking? Authorizing Provider  fexofenadine (ALLEGRA) 180 MG tablet Take 180 mg by mouth daily.   Yes Historical Provider, MD   BP 125/63  Pulse 96  Temp(Src) 99.2 F (37.3 C) (Oral)  Resp 31  Ht 5\' 5"  (1.651 m)  Wt 224 lb (101.606 kg)  BMI 37.28 kg/m2  SpO2 100%  LMP 07/09/2013 Physical Exam  Nursing note and vitals reviewed.  48 year old female, resting comfortably and in no acute distress. Vital signs are significant for  tachypnea with respiratory rate of 31. Oxygen saturation is 100%, which is normal. Head is normocephalic and atraumatic. PERRLA, EOMI. Oropharynx is clear. Neck is nontender and supple without adenopathy or JVD. Back is nontender and there is no CVA tenderness. Lungs have moderate expiratory wheezes. There are no rales or rhonchi. Chest is nontender. Heart has regular rate and rhythm without murmur. Abdomen is soft, flat, nontender without masses or hepatosplenomegaly and peristalsis is normoactive. Extremities have 1+ edema, full range of motion is present. Skin is warm and dry without rash. Neurologic: Mental status is normal, cranial nerves are intact, there are no motor or sensory deficits.  ED Course  Procedures (including critical care time) Labs Review Results for orders placed during the hospital encounter of 07/10/13  CBC      Result Value Ref Range   WBC 6.4  4.0 - 10.5 K/uL   RBC 4.20  3.87 - 5.11 MIL/uL   Hemoglobin 7.8 (*) 12.0 - 15.0 g/dL   HCT 28.2 (*) 36.0 - 46.0 %   MCV 67.1 (*) 78.0 - 100.0 fL   MCH 18.6 (*) 26.0 - 34.0 pg   MCHC 27.7 (*) 30.0 - 36.0 g/dL   RDW 21.0 (*) 11.5 - 15.5 %   Platelets    150 - 400 K/uL   Value: PLATELET CLUMPS NOTED ON SMEAR, COUNT APPEARS ADEQUATE  BASIC METABOLIC PANEL      Result Value  Ref Range   Sodium 143  137 - 147 mEq/L   Potassium 3.7  3.7 - 5.3 mEq/L   [place] 108  96 - 112 mEq/L   CO2 24  19 - 32 mEq/L   Glucose, Bld 106 (*) 70 - 99 mg/dL   BUN 18  6 - 23 mg/dL   Creatinine, Ser 0.83  0.50 - 1.10 mg/dL   Calcium 8.3 (*) 8.4 - 10.5 mg/dL   GFR calc non Af Amer 83 (*) >90 mL/min   GFR calc Af Amer >90  >90 mL/min  PRO B NATRIURETIC PEPTIDE      Result Value Ref Range   Pro B Natriuretic peptide (BNP) 55.7  0 - 125 pg/mL  I-STAT TROPOININ, ED      Result Value Ref Range   Troponin i, poc 0.00  0.00 - 0.08 ng/mL   Comment 3           POC URINE PREG, ED      Result Value Ref Range   Preg Test, Ur NEGATIVE  NEGATIVE    Imaging Review Dg Chest Port 1 View  07/10/2013   CLINICAL DATA:  Chest pain  EXAM: PORTABLE CHEST - 1 VIEW  COMPARISON:  None.  FINDINGS: The heart size and mediastinal contours are within normal limits. Both lungs are clear. The visualized skeletal structures are unremarkable.  IMPRESSION: No active disease.   Electronically Signed   By: Inez Catalina M.D.   On: 07/10/2013 23:49     EKG Interpretation   Date/Time:  Sunday Jul 10 2013 22:38:42 EDT Ventricular Rate:  103 PR Interval:  130 QRS Duration: 70 QT Interval:  346 QTC Calculation: 453 R Axis:   67 Text Interpretation:  Sinus tachycardia Otherwise normal ECG No old  tracing to compare Confirmed by [facility]  MD, Denis Koppel ([id number]) on 07/11/2013  12:38:05 AM      MDM   Final diagnoses:  Asthma  Anemia    Chest discomfort and dyspnea which seems to be related to bronchospasm and asthma. She will be given albuterol with ipratropium. Chest x-ray and ECG are unremarkable. Hemoglobin is noted to be very low at 7.8. She does relate that she had problems with heavy menses about 4 or 5 months ago. I anticipate the need to send her home with a prescription for ferrous sulfate. I have a very low index of suspicion for cardiac disease. Orthostatic vital signs will be checked to make sure that her hemoglobin is not represent relatively recent blood loss. There no old records available for this patient.  Orthostatic vital signs showed no significant change in pulse or blood pressure indicating anemia is somewhat chronic. She notes slight improvement following initial albuterol with ipratropium. She's given a second treatment and feels like her lungs are completely clear. On exam, lungs are clear. She's given a dose of prednisone. She is discharged with an albuterol inhaler and prescription is given for prednisone. For her anemia, she is given prescription for ferrous sulfate and she is referred to [place] [facility] for followup.  Delora Fuel, MD 92/33/00 [id number]

## 2013-08-14 ENCOUNTER — Ambulatory Visit (INDEPENDENT_AMBULATORY_CARE_PROVIDER_SITE_OTHER): Payer: Self-pay

## 2013-08-14 ENCOUNTER — Ambulatory Visit (INDEPENDENT_AMBULATORY_CARE_PROVIDER_SITE_OTHER): Payer: Self-pay | Admitting: Internal Medicine

## 2013-08-14 DIAGNOSIS — S139XXA Sprain of joints and ligaments of unspecified parts of neck, initial encounter: Secondary | ICD-10-CM

## 2013-08-14 DIAGNOSIS — S1093XA Contusion of unspecified part of neck, initial encounter: Secondary | ICD-10-CM

## 2013-08-14 DIAGNOSIS — M542 Cervicalgia: Secondary | ICD-10-CM

## 2013-08-14 DIAGNOSIS — S161XXA Strain of muscle, fascia and tendon at neck level, initial encounter: Secondary | ICD-10-CM

## 2013-08-14 DIAGNOSIS — S20219A Contusion of unspecified front wall of thorax, initial encounter: Secondary | ICD-10-CM

## 2013-08-14 DIAGNOSIS — Z6841 Body Mass Index (BMI) 40.0 and over, adult: Secondary | ICD-10-CM | POA: Insufficient documentation

## 2013-08-14 DIAGNOSIS — S0003XA Contusion of scalp, initial encounter: Secondary | ICD-10-CM

## 2013-08-14 DIAGNOSIS — S0083XA Contusion of other part of head, initial encounter: Secondary | ICD-10-CM

## 2013-08-14 MED ORDER — CYCLOBENZAPRINE HCL 10 MG PO TABS: 10.0000 mg | ORAL_TABLET | Freq: Every day | ORAL | Status: DC

## 2013-08-14 MED ORDER — MELOXICAM 15 MG PO TABS: 15.0000 mg | ORAL_TABLET | Freq: Every day | ORAL | Status: DC
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
## 2013-12-16 DIAGNOSIS — Z0271 Encounter for disability determination: Secondary | ICD-10-CM

## 2016-03-12 ENCOUNTER — Emergency Department (HOSPITAL_COMMUNITY)
Admission: EM | Admit: 2016-03-12 | Discharge: 2016-03-13 | Disposition: A | Discharge status: Home / Self Care | Payer: Self-pay | Attending: Emergency Medicine | Admitting: Emergency Medicine

## 2016-03-12 ENCOUNTER — Encounter (HOSPITAL_COMMUNITY): Payer: Self-pay | Admitting: *Deleted

## 2016-03-12 ENCOUNTER — Emergency Department (HOSPITAL_COMMUNITY): Payer: Self-pay

## 2016-03-12 DIAGNOSIS — N939 Abnormal uterine and vaginal bleeding, unspecified: Secondary | ICD-10-CM

## 2016-03-12 DIAGNOSIS — B349 Viral infection, unspecified: Secondary | ICD-10-CM

## 2016-03-12 DIAGNOSIS — Z79899 Other long term (current) drug therapy: Secondary | ICD-10-CM | POA: Insufficient documentation

## 2016-03-12 HISTORY — DX: Obesity, unspecified: E66.9

## 2016-03-12 LAB — BASIC METABOLIC PANEL
Anion gap: 8 (ref 5–15)
BUN: 8 mg/dL (ref 6–20)
CALCIUM: 8.1 mg/dL — AB (ref 8.9–10.3)
CO2: 22 mmol/L (ref 22–32)
CREATININE: 0.62 mg/dL (ref 0.44–1.00)
Chloride: 107 mmol/L (ref 101–111)
GFR calc Af Amer: >60 mL/min (ref >60)
Glucose, Bld: 103 mg/dL — ABNORMAL HIGH (ref 65–99)
POTASSIUM: 3.3 mmol/L — AB (ref 3.5–5.1)
Sodium: 137 mmol/L (ref 135–145)

## 2016-03-12 LAB — I-STAT BETA HCG BLOOD, ED (MC, WL, AP ONLY)

## 2016-03-12 LAB — CBC
HCT: 30.4 % — ABNORMAL LOW (ref 36.0–46.0)
Hemoglobin: 9.3 g/dL — ABNORMAL LOW (ref 12.0–15.0)
MCH: 22.6 pg — ABNORMAL LOW (ref 26.0–34.0)
MCHC: 30.6 g/dL (ref 30.0–36.0)
MCV: 73.8 fL — AB (ref 78.0–100.0)
PLATELETS: 250 10*3/uL (ref 150–400)
RBC: 4.12 MIL/uL (ref 3.87–5.11)
RDW: 18.5 % — AB (ref 11.5–15.5)
WBC: 7.2 10*3/uL (ref 4.0–10.5)

## 2016-03-12 NOTE — ED Triage Notes (Signed)
Pt reports irregular and heavy menstrual cycles. Last normal cycle was 3 months ago. Also having recent cough, cold and fever. No resp distress noted.

## 2016-03-12 NOTE — ED Provider Notes (Signed)
[facility] DEPT Provider Note   CSN: [id number] Arrival date & time: 03/12/16  1721     History   Chief Complaint Chief Complaint  Patient presents with  . Vaginal Bleeding  . Fever    HPI Sarah Norman is a 51 y.o. female.  Sitting obese Hispanic female who reports that she's had 3 months vaginal bleeding varying in intensity she may have 2 or 3 days with no bleeding followed by heavy vaginal bleeding.  Also for the past 2 weeks she has had intermittent sore throat and cough she states it'll be there for 1 week off for several days and then return.  She has taken Advil for her throat discomfort with success      Past Medical History:  Diagnosis Date  . Anemia   . Obesity     Patient Active Problem List   Diagnosis Date Noted  . Severe obesity (BMI >= 40) ([place]) 08/14/2013    History reviewed. No pertinent surgical history.  OB History    No data available       Home Medications    Prior to Admission medications   Medication Sig Start Date End Date Taking? Authorizing Provider  cyclobenzaprine (FLEXERIL) 10 MG tablet Take 1 tablet (10 mg total) by mouth at bedtime. 08/14/13   Leandrew Koyanagi, MD  ferrous sulfate 325 (65 FE) MG tablet Take 1 tablet (325 mg total) by mouth 2 (two) times daily with a meal. [id number]   Delora Fuel, MD  fexofenadine (ALLEGRA) 180 MG tablet Take 180 mg by mouth daily.    Historical Provider, MD  meloxicam (MOBIC) 15 MG tablet Take 1 tablet (15 mg total) by mouth daily. 08/14/13   Leandrew Koyanagi, MD    Family History History reviewed. No pertinent family history.  Social History Social History  Substance Use Topics  . Smoking status: Never Smoker  . Smokeless tobacco: Not on file  . Alcohol use No     Allergies   Patient has no known allergies.   Review of Systems Review of Systems  Constitutional: Negative for chills and fever.  HENT: Positive for sore throat.   Respiratory: Positive for cough.     Gastrointestinal: Negative for nausea and vomiting.  Genitourinary: Positive for vaginal bleeding. Negative for dysuria and vaginal discharge.  Musculoskeletal: Negative for myalgias.  All other systems reviewed and are negative.    Physical Exam Updated Vital Signs BP 162/74 (BP Location: Left Arm)   Pulse 97   Temp 99.7 F (37.6 C) (Oral)   Resp 18   SpO2 100%   Physical Exam  Constitutional: She appears well-developed and well-nourished.  HENT:  Head: Normocephalic and atraumatic.  Right Ear: External ear normal.  Left Ear: External ear normal.  Mouth/Throat: Oropharynx is clear and moist. No oropharyngeal exudate.  Eyes: Pupils are equal, round, and reactive to light.  Neck: Normal range of motion.  Cardiovascular: Normal rate.   Pulmonary/Chest: Effort normal and breath sounds normal.  Nonproductive cough  Abdominal: Soft.  Genitourinary: Cervix exhibits no discharge and no friability. There is bleeding in the vagina. Vaginal discharge found.  Musculoskeletal: Normal range of motion.  Lymphadenopathy:    She has cervical adenopathy.  Neurological: She is alert.  Skin: Skin is warm and dry.  Psychiatric: She has a normal mood and affect.  Nursing note and vitals reviewed.    ED Treatments / Results  Labs (all labs ordered are listed, but only abnormal results are displayed) Labs  Reviewed  CBC - Abnormal; Notable for the following:       Result Value   Hemoglobin 9.3 (*)    HCT 30.4 (*)    MCV 73.8 (*)    MCH 22.6 (*)    RDW 18.5 (*)    All other components within normal limits  BASIC METABOLIC PANEL - Abnormal; Notable for the following:    Potassium 3.3 (*)    Glucose, Bld 103 (*)    Calcium 8.1 (*)    All other components within normal limits  RAPID STREP SCREEN (NOT AT [facility])  WET PREP, GENITAL  I-STAT BETA HCG BLOOD, ED ([facility], [facility], [facility] ONLY)  GC/CHLAMYDIA PROBE AMP ([place]) NOT AT [facility]    EKG  EKG Interpretation None       Radiology No  results found.  Procedures Procedures (including critical care time)  Medications Ordered in ED Medications - No data to display   Initial Impression / Assessment and Plan / ED Course  I have reviewed the triage vital signs and the nursing notes.  Pertinent labs & imaging results that were available during my care of the patient were reviewed by me and considered in my medical decision making (see chart for details).  Clinical Course    Vaginal bleeding with closed cervix will start Provera and refer to GYN    Final Clinical Impressions(s) / ED Diagnoses   Final diagnoses:  None    New Prescriptions New Prescriptions   No medications on file     Junius Creamer, NP 03/13/16 0023    Junius Creamer, NP 03/13/16 [place], MD 03/13/16 [id number]

## 2016-03-12 NOTE — ED Notes (Signed)
Patient transported to X-ray 

## 2016-03-13 LAB — WET PREP, GENITAL
Clue Cells Wet Prep HPF POC: NONE SEEN
Sperm: NONE SEEN
Trich, Wet Prep: NONE SEEN
Yeast Wet Prep HPF POC: NONE SEEN

## 2016-03-13 LAB — GC/CHLAMYDIA PROBE AMP (~~LOC~~) NOT AT ARMC
CHLAMYDIA, DNA PROBE: NEGATIVE
Neisseria Gonorrhea: NEGATIVE

## 2016-03-13 LAB — RAPID STREP SCREEN (MED CTR MEBANE ONLY): Streptococcus, Group A Screen (Direct): NEGATIVE

## 2016-03-13 MED ORDER — MEDROXYPROGESTERONE ACETATE 5 MG PO TABS: 5.0000 mg | ORAL_TABLET | Freq: Every day | ORAL | 0 refills | Status: DC

## 2016-03-13 MED ORDER — MEDROXYPROGESTERONE ACETATE 10 MG PO TABS
10.0000 mg | ORAL_TABLET | Freq: Every day | ORAL | Status: DC
  Administered 2016-03-13: 10 mg via ORAL
  Filled 2016-03-13: qty 1

## 2016-03-13 NOTE — Discharge Instructions (Signed)
Take the medication as directed until all tablets have been taken Make an appointment with the [facility] clinic for further evaluation  You can safely take over the counter decongestants for your congestion, ear pressure

## 2016-03-15 LAB — CULTURE, GROUP A STREP (THRC)

## 2016-03-26 ENCOUNTER — Encounter (HOSPITAL_COMMUNITY): Payer: Self-pay | Admitting: *Deleted

## 2016-03-26 ENCOUNTER — Inpatient Hospital Stay (HOSPITAL_COMMUNITY)
Admission: EM | Admit: 2016-03-26 | Discharge: 2016-03-28 | DRG: 760 | Disposition: A | Discharge status: Home / Self Care | Payer: Self-pay | Attending: Internal Medicine | Admitting: Internal Medicine

## 2016-03-26 DIAGNOSIS — Z833 Family history of diabetes mellitus: Secondary | ICD-10-CM

## 2016-03-26 DIAGNOSIS — D649 Anemia, unspecified: Secondary | ICD-10-CM | POA: Diagnosis present

## 2016-03-26 DIAGNOSIS — D509 Iron deficiency anemia, unspecified: Secondary | ICD-10-CM | POA: Diagnosis present

## 2016-03-26 DIAGNOSIS — Z79899 Other long term (current) drug therapy: Secondary | ICD-10-CM

## 2016-03-26 DIAGNOSIS — N920 Excessive and frequent menstruation with regular cycle: Principal | ICD-10-CM | POA: Diagnosis present

## 2016-03-26 DIAGNOSIS — N83202 Unspecified ovarian cyst, left side: Secondary | ICD-10-CM | POA: Diagnosis present

## 2016-03-26 DIAGNOSIS — R938 Abnormal findings on diagnostic imaging of other specified body structures: Secondary | ICD-10-CM | POA: Diagnosis present

## 2016-03-26 DIAGNOSIS — N939 Abnormal uterine and vaginal bleeding, unspecified: Secondary | ICD-10-CM

## 2016-03-26 DIAGNOSIS — D62 Acute posthemorrhagic anemia: Secondary | ICD-10-CM | POA: Diagnosis present

## 2016-03-26 HISTORY — DX: Other asthma: J45.998

## 2016-03-26 HISTORY — DX: Personal history of other medical treatment: Z92.89

## 2016-03-26 LAB — BASIC METABOLIC PANEL
ANION GAP: 5 (ref 5–15)
BUN: 10 mg/dL (ref 6–20)
CALCIUM: 7.8 mg/dL — AB (ref 8.9–10.3)
CO2: 25 mmol/L (ref 22–32)
Chloride: 109 mmol/L (ref 101–111)
Creatinine, Ser: 0.7 mg/dL (ref 0.44–1.00)
GLUCOSE: 105 mg/dL — AB (ref 65–99)
Potassium: 3.4 mmol/L — ABNORMAL LOW (ref 3.5–5.1)
Sodium: 139 mmol/L (ref 135–145)

## 2016-03-26 LAB — CBC WITH DIFFERENTIAL/PLATELET
BASOS ABS: 0 10*3/uL (ref 0.0–0.1)
Basophils Relative: 0 %
EOS PCT: 1 %
Eosinophils Absolute: 0.1 10*3/uL (ref 0.0–0.7)
HEMATOCRIT: 18.9 % — AB (ref 36.0–46.0)
Hemoglobin: 5.7 g/dL — CL (ref 12.0–15.0)
LYMPHS PCT: 23 %
Lymphs Abs: 1.6 10*3/uL (ref 0.7–4.0)
MCH: 22.6 pg — ABNORMAL LOW (ref 26.0–34.0)
MCHC: 30.2 g/dL (ref 30.0–36.0)
MCV: 75 fL — AB (ref 78.0–100.0)
Monocytes Absolute: 0.3 10*3/uL (ref 0.1–1.0)
Monocytes Relative: 4 %
NEUTROS ABS: 5 10*3/uL (ref 1.7–7.7)
Neutrophils Relative %: 72 %
PLATELETS: 236 10*3/uL (ref 150–400)
RBC: 2.52 MIL/uL — AB (ref 3.87–5.11)
RDW: 18.6 % — AB (ref 11.5–15.5)
WBC: 6.9 10*3/uL (ref 4.0–10.5)

## 2016-03-26 LAB — I-STAT CHEM 8, ED
BUN: 10 mg/dL (ref 6–20)
CHLORIDE: 108 mmol/L (ref 101–111)
CREATININE: 0.7 mg/dL (ref 0.44–1.00)
Calcium, Ion: 1.1 mmol/L — ABNORMAL LOW (ref 1.15–1.40)
GLUCOSE: 107 mg/dL — AB (ref 65–99)
HCT: 16 % — ABNORMAL LOW (ref 36.0–46.0)
Hemoglobin: 5.4 g/dL — CL (ref 12.0–15.0)
POTASSIUM: 3.4 mmol/L — AB (ref 3.5–5.1)
Sodium: 142 mmol/L (ref 135–145)
TCO2: 24 mmol/L (ref 0–100)

## 2016-03-26 LAB — I-STAT BETA HCG BLOOD, ED (MC, WL, AP ONLY): I-stat hCG, quantitative: <5 m[IU]/mL (ref <5)

## 2016-03-26 LAB — ABO/RH: ABO/RH(D): O POS

## 2016-03-26 LAB — PREPARE RBC (CROSSMATCH)

## 2016-03-26 MED ORDER — ONDANSETRON HCL 4 MG/2ML IJ SOLN: 4.0000 mg | Freq: Four times a day (QID) | INTRAMUSCULAR | Status: DC | PRN

## 2016-03-26 MED ORDER — FERROUS SULFATE 325 (65 FE) MG PO TABS
325.0000 mg | ORAL_TABLET | Freq: Two times a day (BID) | ORAL | Status: DC
  Administered 2016-03-27 – 2016-03-28 (×3): 325 mg via ORAL
  Filled 2016-03-26 (×4): qty 1

## 2016-03-26 MED ORDER — SODIUM CHLORIDE 0.9 % IV SOLN
Freq: Once | INTRAVENOUS | Status: AC
  Administered 2016-03-26: 23:00:00 via INTRAVENOUS

## 2016-03-26 MED ORDER — ACETAMINOPHEN 650 MG RE SUPP: 650.0000 mg | Freq: Four times a day (QID) | RECTAL | Status: DC | PRN

## 2016-03-26 MED ORDER — ESTROGENS CONJUGATED 25 MG IJ SOLR
25.0000 mg | Freq: Four times a day (QID) | INTRAMUSCULAR | Status: DC | PRN
  Filled 2016-03-26: qty 25

## 2016-03-26 MED ORDER — ESTROGENS CONJUGATED 25 MG IJ SOLR
25.0000 mg | Freq: Once | INTRAMUSCULAR | Status: AC
  Administered 2016-03-26: 25 mg via INTRAVENOUS
  Filled 2016-03-26: qty 25

## 2016-03-26 MED ORDER — STERILE WATER FOR INJECTION IJ SOLN
INTRAMUSCULAR | Status: AC
  Administered 2016-03-27: 20 mL
  Filled 2016-03-26: qty 10

## 2016-03-26 MED ORDER — MEGESTROL ACETATE 40 MG PO TABS
120.0000 mg | ORAL_TABLET | Freq: Every day | ORAL | Status: DC
  Administered 2016-03-26 – 2016-03-27 (×2): 120 mg via ORAL
  Filled 2016-03-26 (×3): qty 3

## 2016-03-26 MED ORDER — SODIUM CHLORIDE 0.9 % IV BOLUS (SEPSIS): 1000.0000 mL | Freq: Once | INTRAVENOUS | Status: DC

## 2016-03-26 MED ORDER — ACETAMINOPHEN 325 MG PO TABS: 650.0000 mg | ORAL_TABLET | Freq: Four times a day (QID) | ORAL | Status: DC | PRN

## 2016-03-26 MED ORDER — ONDANSETRON HCL 4 MG PO TABS
4.0000 mg | ORAL_TABLET | Freq: Four times a day (QID) | ORAL | Status: DC | PRN
Start: 2016-03-26 — End: 2016-03-28

## 2016-03-26 MED ORDER — MEGESTROL ACETATE 40 MG PO TABS: 40.0000 mg | ORAL_TABLET | ORAL | 0 refills | Status: DC

## 2016-03-26 NOTE — H&P (Signed)
History and Physical    Sarah Norman [id number] DOB: 1965-06-06 DOA: 03/26/2016  PCP: No PCP Per Patient  Patient coming from: Home.  Chief Complaint: Vaginal bleeding.  HPI: Sarah Norman is a 51 y.o. female with no significant past medical history presents to the ER because of persistent vaginal bleeding. Patient had come to the ER on January 10 with similar complaints and was prescribed Provera and referred to OB/GYN . Patient states her vaginal bleeding started again after her Provera got over. In the ER ER physician had done pelvic exam which shows oozing of vaginal bleeding. ER physician had discussed with Dr. Elonda Husky, on-call OB/GYN who advised patient to be started on Premarin 25 mg every 6 hourly until bleeding stops and Megace 120 mg daily and to follow with OB/GYN as outpatient. Since patient's hemoglobin has dropped from previous of 8-5 patient is being admitted for transfusion. Patient states she also gets fatigued easily with exertion.   ED Course: See history of presenting illness.  Review of Systems: As per HPI, rest all negative.   Past Medical History:  Diagnosis Date  . Anemia   . Obesity     History reviewed. No pertinent surgical history.   reports that she has never smoked. She has never used smokeless tobacco. She reports that she does not drink alcohol or use drugs.  No Known Allergies  Family History  Problem Relation Age of Onset  . Diabetes Mellitus II Father     Prior to Admission medications   Medication Sig Start Date End Date Taking? Authorizing Provider  ferrous sulfate 325 (65 FE) MG tablet Take 1 tablet (325 mg total) by mouth 2 (two) times daily with a meal. [id number]  Yes Delora Fuel, MD  cyclobenzaprine (FLEXERIL) 10 MG tablet Take 1 tablet (10 mg total) by mouth at bedtime. Patient not taking: Reported on 03/26/2016 08/14/13   Leandrew Koyanagi, MD  medroxyPROGESTERone (PROVERA) 5 MG tablet Take 1 tablet (5 mg total) by mouth  daily. Patient not taking: Reported on 03/26/2016 03/13/16   Junius Creamer, NP  megestrol (MEGACE) 40 MG tablet Take 1 tablet (40 mg total) by mouth See admin instructions. Take 1 tablet (40 mg) by mouth three times daily until bleeding stops. Then take 1 tablet once daily until follow-up with OB 03/26/16   Forde Dandy, MD  meloxicam (MOBIC) 15 MG tablet Take 1 tablet (15 mg total) by mouth daily. Patient not taking: Reported on 03/26/2016 08/14/13   Leandrew Koyanagi, MD    Physical Exam: Vitals:   03/26/16 2225 03/26/16 2230 03/26/16 2308 03/26/16 2315  BP: 120/63 111/60 117/68 129/68  Pulse: 90 89 88 91  Resp: 24 15 22 15   Temp: 99.1 F (37.3 C)  98.9 F (37.2 C)   TempSrc: Oral  Oral   SpO2: 100% 100% 100% 100%      Constitutional: Moderately built and nourished. Vitals:   03/26/16 2225 03/26/16 2230 03/26/16 2308 03/26/16 2315  BP: 120/63 111/60 117/68 129/68  Pulse: 90 89 88 91  Resp: 24 15 22 15   Temp: 99.1 F (37.3 C)  98.9 F (37.2 C)   TempSrc: Oral  Oral   SpO2: 100% 100% 100% 100%   Eyes: Anicteric mild pallor. ENMT: No discharge from the ears eyes nose or mouth. Neck: No mass felt. No neck rigidity. Respiratory: No rhonchi or crepitations. Cardiovascular: S1-S2 heard. No murmurs appreciated. Abdomen: Soft nontender bowel sounds present. No guarding or rigidity. Musculoskeletal: No  edema. No joint effusion. Skin: No rash. Skin appears warm. Neurologic: Alert awake oriented to time place and person. Moves all extremities. Psychiatric: Appears normal. Normal affect.   Labs on Admission: I have personally reviewed following labs and imaging studies  CBC:  Recent Labs Lab 03/26/16 2109 03/26/16 2114  WBC  --  6.9  NEUTROABS  --  5.0  HGB 5.4* 5.7*  HCT 16.0* 18.9*  MCV  --  75.0*  PLT  --  [id number]   Basic Metabolic Panel:  Recent Labs Lab 03/26/16 2109 03/26/16 2114  NA 142 139  K 3.4* 3.4*  CL 108 109  CO2  --  25  GLUCOSE 107* 105*  BUN 10 10   CREATININE 0.70 0.70  CALCIUM  --  7.8*   GFR: CrCl cannot be calculated (Unknown ideal weight.). Liver Function Tests: No results for input(s): AST, ALT, ALKPHOS, BILITOT, PROT, ALBUMIN in the last 168 hours. No results for input(s): LIPASE, AMYLASE in the last 168 hours. No results for input(s): AMMONIA in the last 168 hours. Coagulation Profile: No results for input(s): INR, PROTIME in the last 168 hours. Cardiac Enzymes: No results for input(s): CKTOTAL, CKMB, CKMBINDEX, TROPONINI in the last 168 hours. BNP (last 3 results) No results for input(s): PROBNP in the last 8760 hours. HbA1C: No results for input(s): HGBA1C in the last 72 hours. CBG: No results for input(s): GLUCAP in the last 168 hours. Lipid Profile: No results for input(s): CHOL, HDL, LDLCALC, TRIG, CHOLHDL, LDLDIRECT in the last 72 hours. Thyroid Function Tests: No results for input(s): TSH, T4TOTAL, FREET4, T3FREE, THYROIDAB in the last 72 hours. Anemia Panel: No results for input(s): VITAMINB12, FOLATE, FERRITIN, TIBC, IRON, RETICCTPCT in the last 72 hours. Urine analysis: No results found for: COLORURINE, APPEARANCEUR, LABSPEC, PHURINE, GLUCOSEU, HGBUR, BILIRUBINUR, KETONESUR, PROTEINUR, UROBILINOGEN, NITRITE, LEUKOCYTESUR Sepsis Labs: @LABRCNTIP (procalcitonin:4,lacticidven:4) )No results found for this or any previous visit (from the past 240 hour(s)).   Radiological Exams on Admission: No results found.    Assessment/Plan Principal Problem:   Symptomatic anemia Active Problems:   Menorrhagia   Anemia    1. Symptomatic anemia microcytic hypochromic - 2 units of PRBC has been ordered for transfusion. Recheck CBC after transfusion. Patient will need to be iron supplements. 2. Menorrhagia - ER physician had discussed with Dr. Elonda Husky, on-call gynecologist who advised patient to be placed on Premarin 25 mg by mouth every 6 hours until bleeding stops and also to be on Megace 120 mg daily and follow-up as  outpatient.   DVT prophylaxis: SCDs. Code Status: Full code.  Family Communication: Discussed with patient.  Disposition Plan: Home.  Consults called: ER physician discussed with OB/GYN.  Admission status: Observation.    Rise Patience MD [facility] Pager [phone].  If 7PM-7AM, please contact night-coverage [email or web address] Password [facility]  03/26/2016, 11:41 PM

## 2016-03-26 NOTE — ED Provider Notes (Signed)
Sarah Norman is a 51 y.o. female, with a history of Anemia, presenting to the ED with abnormal vaginal bleeding for the last 3 months.   HPI from Brantley Stage, MD: "Sarah Norman is a 51 y.o. female with a PMHx of anemia, who presents to the Emergency Department complaining of vaginal bleeding onset 4 months ago. Pt is having associated symptoms of passing clots, fatigue, lightheadedness, and generalized weakness.  Pt notes that she was seen last week in the ED for similar symptoms and Rx 5 days of provera. After prescription discontinued, she had return of bleeding, but now worse. Pt states that she uses 10 pads daily. Pt states that she hasn't been able to obtain a GYN appointment for further evaluation, next available appointment in 05/2016. She denies abdominal pain, nausea, vomiting, and any other symptoms. Denies having insurance at this time."   Past Medical History:  Diagnosis Date  . Anemia   . Obesity       Physical Exam  BP 136/84 (BP Location: Left Arm)   Pulse 98   Temp 99 F (37.2 C) (Oral)   Resp 18   SpO2 100%   Physical Exam  Constitutional: She appears well-developed and well-nourished. No distress.  HENT:  Head: Normocephalic and atraumatic.  Eyes: Conjunctivae are normal.  Neck: Neck supple.  Cardiovascular: Normal rate, regular rhythm, normal heart sounds and intact distal pulses.   Pulmonary/Chest: Effort normal and breath sounds normal. No respiratory distress.  Abdominal: Soft. There is no tenderness. There is no guarding.  Musculoskeletal: She exhibits no edema.  Lymphadenopathy:    She has no cervical adenopathy.  Neurological: She is alert.  Skin: Skin is warm and dry. She is not diaphoretic.  Pallor in the mucous membranes.  Psychiatric: She has a normal mood and affect. Her behavior is normal.  Nursing note and vitals reviewed.   ED Course  Procedures   Results for orders placed or performed during the hospital encounter of 03/26/16  CBC with  Differential  Result Value Ref Range   WBC 6.9 4.0 - 10.5 K/uL   RBC 2.52 (L) 3.87 - 5.11 MIL/uL   Hemoglobin 5.7 (LL) 12.0 - 15.0 g/dL   HCT 18.9 (L) 36.0 - 46.0 %   MCV 75.0 (L) 78.0 - 100.0 fL   MCH 22.6 (L) 26.0 - 34.0 pg   MCHC 30.2 30.0 - 36.0 g/dL   RDW 18.6 (H) 11.5 - 15.5 %   Platelets 236 150 - 400 K/uL   Neutrophils Relative % 72 %   Neutro Abs 5.0 1.7 - 7.7 K/uL   Lymphocytes Relative 23 %   Lymphs Abs 1.6 0.7 - 4.0 K/uL   Monocytes Relative 4 %   Monocytes Absolute 0.3 0.1 - 1.0 K/uL   Eosinophils Relative 1 %   Eosinophils Absolute 0.1 0.0 - 0.7 K/uL   Basophils Relative 0 %   Basophils Absolute 0.0 0.0 - 0.1 K/uL  Basic metabolic panel  Result Value Ref Range   Sodium 139 135 - 145 mmol/L   Potassium 3.4 (L) 3.5 - 5.1 mmol/L   Chloride 109 101 - 111 mmol/L   CO2 25 22 - 32 mmol/L   Glucose, Bld 105 (H) 65 - 99 mg/dL   BUN 10 6 - 20 mg/dL   Creatinine, Ser 0.70 0.44 - 1.00 mg/dL   Calcium 7.8 (L) 8.9 - 10.3 mg/dL   GFR calc non Af Amer >60 >60 mL/min   GFR calc Af Amer >60 >  60 mL/min   Anion gap 5 5 - 15  I-Stat Beta hCG blood, ED ([facility], [facility], [facility] only)  Result Value Ref Range   I-stat hCG, quantitative <5.0 <5 mIU/mL   Comment 3          I-Stat Chem 8, ED  Result Value Ref Range   Sodium 142 135 - 145 mmol/L   Potassium 3.4 (L) 3.5 - 5.1 mmol/L   Chloride 108 101 - 111 mmol/L   BUN 10 6 - 20 mg/dL   Creatinine, Ser 0.70 0.44 - 1.00 mg/dL   Glucose, Bld 107 (H) 65 - 99 mg/dL   Calcium, Ion 1.10 (L) 1.15 - 1.40 mmol/L   TCO2 24 0 - 100 mmol/L   Hemoglobin 5.4 (LL) 12.0 - 15.0 g/dL   HCT 16.0 (L) 36.0 - 46.0 %   Comment NOTIFIED PHYSICIAN   Type and screen [place]  Result Value Ref Range   ABO/RH(D) O POS    Antibody Screen PENDING    Sample Expiration 03/29/2016    Hemoglobin  Date Value Ref Range Status  03/26/2016 5.7 (LL) 12.0 - 15.0 g/dL Final    Comment:    REPEATED TO VERIFY CRITICAL VALUE NOTED.  VALUE IS CONSISTENT  WITH PREVIOUSLY REPORTED AND CALLED VALUE.   03/26/2016 5.4 (LL) 12.0 - 15.0 g/dL Final  03/12/2016 9.3 (L) 12.0 - 15.0 g/dL Final  07/10/2013 7.8 (L) 12.0 - 15.0 g/dL Final    MDM Took patient care handoff report from Brantley Stage, MD. Verify hemoglobin, transfuse, and admit for symptomatic anemia.  CBC returned with hemoglobin of 5.7. Transfusion ordered. Admission.  10:17 PM Spoke with Dr. Hal Hope, hospitalist, who agreed to admit the patient to telemetry observation.    Lorayne Bender, PA-C 03/28/16 Galien Liu, MD 04/01/16 [phone]

## 2016-03-26 NOTE — ED Provider Notes (Signed)
[facility] DEPT Provider Note   CSN: [id number] Arrival date & time: 03/26/16  1639   By signing my name below, I, Soijett Blue, attest that this documentation has been prepared under the direction and in the presence of Forde Dandy, MD. Electronically Signed: Soijett Blue, ED Scribe. 03/26/16. 7:35 PM.  History   Chief Complaint Chief Complaint  Patient presents with  . Vaginal Bleeding    HPI Sarah Norman is a 51 y.o. female with a PMHx of anemia, who presents to the Emergency Department complaining of vaginal bleeding onset 4 months ago. Pt is having associated symptoms of passing clots, fatigue, lightheadedness, and generalized weakness.  Pt notes that she was seen last week in the ED for similar symptoms and Rx 5 days of provera. After prescription discontinued, she had return of bleeding, but now worse. Pt states that she uses 10 pads daily. Pt states that she hasn't been able to obtain a GYN appointment for further evaluation, next available appointment in 05/2016. She denies abdominal pain, nausea, vomiting, and any other symptoms. Denies having insurance at this time.    The history is provided by the patient. No language interpreter was used.    Past Medical History:  Diagnosis Date  . Anemia   . Obesity     Patient Active Problem List   Diagnosis Date Noted  . Severe obesity (BMI >= 40) ([place]) 08/14/2013    History reviewed. No pertinent surgical history.  OB History    No data available       Home Medications    Prior to Admission medications   Medication Sig Start Date End Date Taking? Authorizing Provider  cyclobenzaprine (FLEXERIL) 10 MG tablet Take 1 tablet (10 mg total) by mouth at bedtime. 08/14/13   Leandrew Koyanagi, MD  ferrous sulfate 325 (65 FE) MG tablet Take 1 tablet (325 mg total) by mouth 2 (two) times daily with a meal. [id number]   Delora Fuel, MD  fexofenadine (ALLEGRA) 180 MG tablet Take 180 mg by mouth daily.    Historical Provider, MD   medroxyPROGESTERone (PROVERA) 5 MG tablet Take 1 tablet (5 mg total) by mouth daily. 03/13/16   Junius Creamer, NP  megestrol (MEGACE) 40 MG tablet Take 1 tablet (40 mg total) by mouth See admin instructions. Take 1 tablet (40 mg) by mouth three times daily until bleeding stops. Then take 1 tablet once daily until follow-up with OB 03/26/16   Forde Dandy, MD  meloxicam (MOBIC) 15 MG tablet Take 1 tablet (15 mg total) by mouth daily. 08/14/13   Leandrew Koyanagi, MD    Family History No family history on file.  Social History Social History  Substance Use Topics  . Smoking status: Never Smoker  . Smokeless tobacco: Not on file  . Alcohol use No     Allergies   Patient has no known allergies.   Review of Systems Review of Systems 10/14 systems reviewed and are negative other than those stated in the HPI  Physical Exam Updated Vital Signs BP 136/84 (BP Location: Left Arm)   Pulse 98   Temp 99 F (37.2 C) (Oral)   Resp 18   SpO2 100%   Physical Exam Physical Exam  Nursing note and vitals reviewed. Constitutional: Non-toxic, and in no acute distress Head: Normocephalic and atraumatic.  Mouth/Throat: Oropharynx is clear and moist.  Neck: Normal range of motion. Neck supple.  Cardiovascular: Normal rate and regular rhythm.   Pulmonary/Chest: Effort normal and  breath sounds normal.  Abdominal: Soft. There is no tenderness. There is no rebound and no guarding.  Musculoskeletal: Normal range of motion.  Neurological: Alert, no facial droop, fluent speech, moves all extremities symmetrically Skin: Skin is warm and dry.  Psychiatric: Cooperative Pelvic: Normal external genitalia. Normal internal genitalia. Blood and blood clot in vagina. Slow and oozing blood from cervix. No discharge. No cervical motion tenderness. No adnexal masses or tenderness.  ED Treatments / Results  DIAGNOSTIC STUDIES: Oxygen Saturation is 100% on RA, nl by my interpretation.    COORDINATION OF  CARE: 7:33 PM Discussed treatment plan with pt at bedside which includes labs, pelvic exam, and pt agreed to plan.   Labs (all labs ordered are listed, but only abnormal results are displayed) Labs Reviewed  I-STAT CHEM 8, ED - Abnormal; Notable for the following:       Result Value   Potassium 3.4 (*)    Glucose, Bld 107 (*)    Calcium, Ion 1.10 (*)    Hemoglobin 5.4 (*)    HCT 16.0 (*)    All other components within normal limits  CBC WITH DIFFERENTIAL/PLATELET  BASIC METABOLIC PANEL  I-STAT BETA HCG BLOOD, ED ([facility], [facility], [facility] ONLY)  TYPE AND SCREEN    EKG  EKG Interpretation None       Radiology No results found.  Procedures Procedures (including critical care time) CRITICAL CARE Performed by: Forde Dandy   Total critical care time: 35 minutes  Critical care time was exclusive of separately billable procedures and treating other patients.  Critical care was necessary to treat or prevent imminent or life-threatening deterioration.  Critical care was time spent personally by me on the following activities: development of treatment plan with patient and/or surrogate as well as nursing, discussions with consultants, evaluation of patient's response to treatment, examination of patient, obtaining history from patient or surrogate, ordering and performing treatments and interventions, ordering and review of laboratory studies, ordering and review of radiographic studies, pulse oximetry and re-evaluation of patient's condition.  Medications Ordered in ED Medications  sodium chloride 0.9 % bolus 1,000 mL (not administered)  conjugated estrogens (PREMARIN) injection 25 mg (not administered)  megestrol (MEGACE) tablet 120 mg (not administered)  conjugated estrogens (PREMARIN) injection 25 mg (not administered)     Initial Impression / Assessment and Plan / ED Course  I have reviewed the triage vital signs and the nursing notes.  Pertinent labs & imaging results that were  available during my care of the patient were reviewed by me and considered in my medical decision making (see chart for details).     Presenting with Metromenorrhagia x 3-4 months. Hgb 9.3 on ED visit 1/10. Likely withdrawal bleeding now that is worse. Abdomen benign, vital signs stable. Pelvic exam with slow ooze of blood from closed cervix. Hgb 5.4 today. Will transfuse.   Discussed with Dr. Elonda Husky from OB/GYN. Recommending premarin 25 mg q6h until bleeding stops, first dose now. Also recommends 120 mg megestrol daily, first dose now. When patient discharge, she should continue 120 mg megestrol daily until follow-up with OB/GYN.   Plan to admit to hospitalist service.  Final Clinical Impressions(s) / ED Diagnoses   Final diagnoses:  Vaginal bleeding  Acute blood loss anemia  Symptomatic anemia   I personally performed the services described in this documentation, which was scribed in my presence. The recorded information has been reviewed and is accurate.    Forde Dandy, MD 03/26/16 2134

## 2016-03-26 NOTE — ED Triage Notes (Signed)
Pt reports irregular periods. Pt was seen for same recently and given a medication that decreased the bleeding. Pt reports finishing the medications and now having more bleeding. Pt states that she was unable to followup due to no available appts.

## 2016-03-26 NOTE — Care Management (Addendum)
ED CM noted patient to be without PCP or health insurance. Patient presented to [facility] ED with c/o vaginal bleeding critical Hgb 5.7 patient being admitted txf planned. . ED CM placed Referral to the [facility] to contact patient regarding establishing primary care.

## 2016-03-27 ENCOUNTER — Observation Stay (HOSPITAL_COMMUNITY): Payer: Self-pay

## 2016-03-27 ENCOUNTER — Encounter (HOSPITAL_COMMUNITY): Payer: Self-pay | Admitting: General Practice

## 2016-03-27 DIAGNOSIS — Z9289 Personal history of other medical treatment: Secondary | ICD-10-CM

## 2016-03-27 HISTORY — DX: Personal history of other medical treatment: Z92.89

## 2016-03-27 LAB — FERRITIN: Ferritin: 10 ng/mL — ABNORMAL LOW (ref 11–307)

## 2016-03-27 LAB — BASIC METABOLIC PANEL
ANION GAP: 6 (ref 5–15)
BUN: 10 mg/dL (ref 6–20)
CHLORIDE: 109 mmol/L (ref 101–111)
CO2: 25 mmol/L (ref 22–32)
Calcium: 7.8 mg/dL — ABNORMAL LOW (ref 8.9–10.3)
Creatinine, Ser: 0.6 mg/dL (ref 0.44–1.00)
GFR calc non Af Amer: >60 mL/min (ref >60)
Glucose, Bld: 116 mg/dL — ABNORMAL HIGH (ref 65–99)
POTASSIUM: 3.7 mmol/L (ref 3.5–5.1)
SODIUM: 140 mmol/L (ref 135–145)

## 2016-03-27 LAB — VITAMIN B12: VITAMIN B 12: 391 pg/mL (ref 180–914)

## 2016-03-27 LAB — CBC
HEMATOCRIT: 22.7 % — AB (ref 36.0–46.0)
Hemoglobin: 7.1 g/dL — ABNORMAL LOW (ref 12.0–15.0)
MCH: 24.5 pg — ABNORMAL LOW (ref 26.0–34.0)
MCHC: 31.3 g/dL (ref 30.0–36.0)
MCV: 78.3 fL (ref 78.0–100.0)
Platelets: 209 10*3/uL (ref 150–400)
RBC: 2.9 MIL/uL — AB (ref 3.87–5.11)
RDW: 18.2 % — ABNORMAL HIGH (ref 11.5–15.5)
WBC: 8.6 10*3/uL (ref 4.0–10.5)

## 2016-03-27 LAB — IRON AND TIBC
IRON: 141 ug/dL (ref 28–170)
Saturation Ratios: 42 % — ABNORMAL HIGH (ref 10.4–31.8)
TIBC: 339 ug/dL (ref 250–450)
UIBC: 198 ug/dL

## 2016-03-27 LAB — RETICULOCYTES
RBC.: 3.93 MIL/uL (ref 3.87–5.11)
RETIC CT PCT: 3.2 % — AB (ref 0.4–3.1)
Retic Count, Absolute: 125.8 10*3/uL (ref 19.0–186.0)

## 2016-03-27 LAB — HEMOGLOBIN AND HEMATOCRIT, BLOOD
HEMATOCRIT: 31.1 % — AB (ref 36.0–46.0)
Hemoglobin: 10.1 g/dL — ABNORMAL LOW (ref 12.0–15.0)

## 2016-03-27 LAB — PREPARE RBC (CROSSMATCH)

## 2016-03-27 LAB — FOLATE: Folate: 15.5 ng/mL (ref >5.9)

## 2016-03-27 MED ORDER — ESTROGENS CONJUGATED 25 MG IJ SOLR
25.0000 mg | Freq: Four times a day (QID) | INTRAMUSCULAR | Status: DC
  Administered 2016-03-27 – 2016-03-28 (×3): 25 mg via INTRAVENOUS
  Filled 2016-03-27 (×3): qty 25

## 2016-03-27 MED ORDER — SODIUM CHLORIDE 0.9 % IV SOLN
Freq: Once | INTRAVENOUS | Status: AC
  Administered 2016-03-27: 11:00:00 via INTRAVENOUS

## 2016-03-27 NOTE — ED Notes (Signed)
Meal trey ordered 

## 2016-03-27 NOTE — ED Notes (Signed)
Family at bedside.  Spouse

## 2016-03-27 NOTE — ED Notes (Signed)
MD at bedside. 

## 2016-03-27 NOTE — Care Management Note (Signed)
Case Management Note  Patient Details  Name: Sarah Norman MRN: [id number] Date of Birth: 08-16-1965  Subjective/Objective:                  From home with spouse. / 51 y.o. female with no significant past medical history presents to the ER because of persistent vaginal bleeding.  Action/Plan: Admit status OBSERVATION (Symptomatic anemia microcytic hypochromic); anticipate discharge [place]. Bettina Gavia for disposition needs PCP ESTABLISHMENT, POSSIBLE MATCH.   Expected Discharge Date:   (unsure)               Expected Discharge Plan:  Home/Self Care  In-House Referral:  NA  Discharge planning Services  CM Consult, [facility]  Post Acute Care Choice:    Choice offered to:     DME Arranged:    DME Agency:     HH Arranged:    HH Agency:     Status of Service:  In process, will continue to follow  If discussed at Long Length of Stay Meetings, dates discussed:    Additional Comments:  Fuller Mandril, RN 03/27/2016, 11:31 AM

## 2016-03-27 NOTE — Progress Notes (Addendum)
PROGRESS NOTE    Sarah Norman  [id number] DOB: 1965-04-17 DOA: 03/26/2016 PCP: No PCP Per Patient  Brief Narrative: Sarah Norman is a 51 y.o. female with h/o menorrhagia, iron defi anemia, presented to the ER because of persistent vaginal bleeding. Patient had come to the ER on January 10 with similar complaints and was prescribed Provera and referred to OB/GYN . Patient states her vaginal bleeding started again after her Provera got over. In the ER ER physician had done pelvic exam which shows oozing of vaginal bleeding. ER physician had discussed with Dr. Elonda Husky, on-call OB/GYN who advised patient to be started on Premarin  Assessment & Plan:     Severe symptomatic anemia -due to heavy menorrhagia, very heavy bleeding with clots for 1 week now, Hb down to 5 -s/p 2units PRBC, will give 2 more units -check Pelvic US -d/w Gyn Dr.Michael Ervin, he will see in FU on 1/29 at 1:40pm -continue Premarin  -discharge on Iron, FU anemia panel  DVT prophylaxis:SCDs Code Status: Full Code Family Communication:None at bedside Disposition Plan:Home when bleeding improvs   Consultants:   Gyn Dr.Michael Ervin   Subjective: Still with heavy bleeding/clots  Objective: Vitals:   03/27/16 1230 03/27/16 1300 03/27/16 1330 03/27/16 1341  BP: 111/72 125/63 119/84   Pulse: 85 90 90   Resp: 22 (!) 30 25   Temp:    98.2 F (36.8 C)  TempSrc:    Oral  SpO2: 100% 100% 100%     Intake/Output Summary (Last 24 hours) at 03/27/16 1406 Last data filed at 03/27/16 0606  Gross per 24 hour  Intake             1187 ml  Output                0 ml  Net             1187 ml   There were no vitals filed for this visit.  Examination:  General exam: Appears calm and comfortable, AAOx3 Respiratory system: Clear to auscultation. Respiratory effort normal. Cardiovascular system: S1 & S2 heard, RRR. No JVD, murmurs, rubs, gallops or clicks. No pedal edema. Gastrointestinal system: Abdomen is  nondistended, soft and nontender. normal bowel sounds heard. Central nervous system: Alert and oriented. No focal neurological deficits. Extremities: Symmetric 5 x 5 power. Skin: No rashes, lesions or ulcers Psychiatry: Judgement and insight appear normal. Mood & affect appropriate.     Data Reviewed: I have personally reviewed following labs and imaging studies  CBC:  Recent Labs Lab 03/26/16 2109 03/26/16 2114 03/27/16 0604  WBC  --  6.9 8.6  NEUTROABS  --  5.0  --   HGB 5.4* 5.7* 7.1*  HCT 16.0* 18.9* 22.7*  MCV  --  75.0* 78.3  PLT  --  236 [id number]   Basic Metabolic Panel:  Recent Labs Lab 03/26/16 2109 03/26/16 2114 03/27/16 0604  NA 142 139 140  K 3.4* 3.4* 3.7  CL 108 109 109  CO2  --  25 25  GLUCOSE 107* 105* 116*  BUN 10 10 10   CREATININE 0.70 0.70 0.60  CALCIUM  --  7.8* 7.8*   GFR: CrCl cannot be calculated (Unknown ideal weight.). Liver Function Tests: No results for input(s): AST, ALT, ALKPHOS, BILITOT, PROT, ALBUMIN in the last 168 hours. No results for input(s): LIPASE, AMYLASE in the last 168 hours. No results for input(s): AMMONIA in the last 168 hours. Coagulation Profile: No results for input(s): INR,  PROTIME in the last 168 hours. Cardiac Enzymes: No results for input(s): CKTOTAL, CKMB, CKMBINDEX, TROPONINI in the last 168 hours. BNP (last 3 results) No results for input(s): PROBNP in the last 8760 hours. HbA1C: No results for input(s): HGBA1C in the last 72 hours. CBG: No results for input(s): GLUCAP in the last 168 hours. Lipid Profile: No results for input(s): CHOL, HDL, LDLCALC, TRIG, CHOLHDL, LDLDIRECT in the last 72 hours. Thyroid Function Tests: No results for input(s): TSH, T4TOTAL, FREET4, T3FREE, THYROIDAB in the last 72 hours. Anemia Panel: No results for input(s): VITAMINB12, FOLATE, FERRITIN, TIBC, IRON, RETICCTPCT in the last 72 hours. Urine analysis: No results found for: COLORURINE, APPEARANCEUR, LABSPEC, PHURINE,  GLUCOSEU, HGBUR, BILIRUBINUR, KETONESUR, PROTEINUR, UROBILINOGEN, NITRITE, LEUKOCYTESUR Sepsis Labs: @LABRCNTIP (procalcitonin:4,lacticidven:4)  )No results found for this or any previous visit (from the past 240 hour(s)).       Radiology Studies: No results found.      Scheduled Meds: . conjugated estrogens  25 mg Intravenous Q6H  . ferrous sulfate  325 mg Oral BID WC  . megestrol  120 mg Oral Daily   Continuous Infusions:   LOS: 0 days    Time spent: 40min    Domenic Polite, MD [facility] Pager [phone]  If 7PM-7AM, please contact night-coverage [email or web address] Password [email or web address] 03/27/2016, 2:06 PM

## 2016-03-28 ENCOUNTER — Telehealth: Payer: Self-pay

## 2016-03-28 LAB — TYPE AND SCREEN
ABO/RH(D): O POS
ANTIBODY SCREEN: NEGATIVE
UNIT DIVISION: 0
Unit division: 0
Unit division: 0
Unit division: 0

## 2016-03-28 LAB — CBC
HCT: 29 % — ABNORMAL LOW (ref 36.0–46.0)
HEMOGLOBIN: 9.4 g/dL — AB (ref 12.0–15.0)
MCH: 25.5 pg — ABNORMAL LOW (ref 26.0–34.0)
MCHC: 32.4 g/dL (ref 30.0–36.0)
MCV: 78.6 fL (ref 78.0–100.0)
Platelets: 230 10*3/uL (ref 150–400)
RBC: 3.69 MIL/uL — AB (ref 3.87–5.11)
RDW: 17.4 % — ABNORMAL HIGH (ref 11.5–15.5)
WBC: 10.5 10*3/uL (ref 4.0–10.5)

## 2016-03-28 LAB — BASIC METABOLIC PANEL
ANION GAP: 4 — AB (ref 5–15)
BUN: 10 mg/dL (ref 6–20)
CALCIUM: 7.6 mg/dL — AB (ref 8.9–10.3)
CHLORIDE: 109 mmol/L (ref 101–111)
CO2: 23 mmol/L (ref 22–32)
Creatinine, Ser: 0.66 mg/dL (ref 0.44–1.00)
GFR calc non Af Amer: >60 mL/min (ref >60)
Glucose, Bld: 124 mg/dL — ABNORMAL HIGH (ref 65–99)
Potassium: 3.5 mmol/L (ref 3.5–5.1)
Sodium: 136 mmol/L (ref 135–145)

## 2016-03-28 MED ORDER — FERROUS SULFATE 325 (65 FE) MG PO TABS: 325.0000 mg | ORAL_TABLET | Freq: Two times a day (BID) | ORAL | 0 refills | Status: DC

## 2016-03-28 MED ORDER — MEDROXYPROGESTERONE ACETATE 10 MG PO TABS: 10.0000 mg | ORAL_TABLET | Freq: Every day | ORAL | 0 refills | Status: DC

## 2016-03-28 NOTE — Telephone Encounter (Signed)
This Case Manager received communication from Laurena Slimmer, RN CM that patient needing a hospital follow-up appointment for symptomatic anemia. Patient uninsured and does not have a PCP. This Case Manager placed call to patient, and patient agreeable to an appointment on 04/02/16 at 1500 at [facility]. Patient provided clinic address and phone number. Patient appreciative of appointment.

## 2016-03-28 NOTE — Progress Notes (Signed)
   03/28/16 0940  Clinical Encounter Type  Visited With Patient  Visit Type Other (Comment) ([place] consult)  Spiritual Encounters  Spiritual Needs Emotional  Stress Factors  Patient Stress Factors None identified  Introduction to Pt. Pt reported no needs at time. Gave a blessing.

## 2016-03-30 NOTE — Discharge Summary (Signed)
Physician Discharge Summary  Sarah Norman [id number] DOB: January 13, 1966 DOA: 03/26/2016  PCP: No PCP Per Patient  Admit date: 03/26/2016 Discharge date: 03/28/2016  Time spent: 35 minutes  Recommendations for Outpatient Follow-up:  1. Gyn Dr.Michael Ervin on 1/29 at [facility] for definitve management of heavy menorrhagia and L ovarian cystic lesion  Discharge Diagnoses:  Severe Iron deficiency anemia Active Problems:   Menorrhagia   Anemia   Discharge Condition:improved Diet recommendation: regular  There were no vitals filed for this visit.  History of present illness:  Sarah Norman a 51 y.o.femalewith h/o menorrhagia, iron defi anemia, presented to the ER because of persistent vaginal bleeding. Patient had come to the ER on January 10 with similar complaints and was prescribed Provera and referred to OB/GYN . Patient states her vaginal bleeding started again after her Provera got over. Admitted with severe symptomatic anemia with Hb of 5  Hospital Course:  Severe Iron deficiency anemia -due to heavy menorrhagia, very heavy bleeding with clots for 1 week now, Hb down to 5. But ongoing for months -given 4units of PRBC this admission -check Pelvic US with mixed echogenicity, thickened endometrium and enlarged cystic lesion in L ovary -d/w Gyn Dr.Michael Ervin he recommended Premarin, bleeding improved on this and recommended outpatient FU, we were able to get her a FU on  1/29 at 1:40pm at [place] -Anemia panel was inaccurate since done after blood transfusion and started on Po Iron at discharge  Consultations:  D/w Gyn [facility]  Discharge Exam: Vitals:   03/27/16 1651 03/28/16 0543  BP: 125/65 128/66  Pulse: 86 76  Resp: 18 18  Temp: 98.3 F (36.8 C) 98.2 F (36.8 C)    General: AAOx3 Cardiovascular: S1S2/RRR Respiratory: CTAB  Discharge Instructions   Discharge Instructions    Diet general    Complete by:  As directed     Increase activity slowly    Complete by:  As directed      Discharge Medication List as of 03/28/2016 11:20 AM    CONTINUE these medications which have CHANGED   Details  ferrous sulfate 325 (65 FE) MG tablet Take 1 tablet (325 mg total) by mouth 2 (two) times daily with a meal., Starting Fri 03/28/2016, Print    medroxyPROGESTERone (PROVERA) 10 MG tablet Take 1 tablet (10 mg total) by mouth daily. Take until Gyn FOllow Up, Starting Fri 03/28/2016, Print      CONTINUE these medications which have NOT CHANGED   Details  cyclobenzaprine (FLEXERIL) 10 MG tablet Take 1 tablet (10 mg total) by mouth at bedtime., Starting Sun 08/14/2013, Normal      STOP taking these medications     meloxicam (MOBIC) 15 MG tablet        No Known Allergies Follow-up Information    Chancy Milroy, MD Follow up on 03/31/2016.   Specialty:  Obstetrics and Gynecology Why:  at 1:40pm Contact information: [address] [place] [address] [phone]            The results of significant diagnostics from this hospitalization (including imaging, microbiology, ancillary and laboratory) are listed below for reference.    Significant Diagnostic Studies: Dg Chest 2 View  Result Date: 03/12/2016 CLINICAL DATA:  Subacute onset of productive cough and fever. Initial encounter. EXAM: CHEST  2 VIEW COMPARISON:  Chest radiograph from 07/10/2013 FINDINGS: The lungs are well-aerated. Peribronchial thickening is noted. There is no evidence of focal opacification, pleural effusion or pneumothorax. The heart is  normal in size; the mediastinal contour is within normal limits. No acute osseous abnormalities are seen. IMPRESSION: Peribronchial thickening noted.  Lungs otherwise grossly clear. Electronically Signed   By: Garald Balding M.D.   On: 03/12/2016 23:22   US Transvaginal Non-ob  Result Date: 03/27/2016 CLINICAL DATA:  Patient with vaginal bleeding for multiple months. History of irregular periods. EXAM:  TRANSABDOMINAL AND TRANSVAGINAL ULTRASOUND OF PELVIS TECHNIQUE: Both transabdominal and transvaginal ultrasound examinations of the pelvis were performed. Transabdominal technique was performed for global imaging of the pelvis including uterus, ovaries, adnexal regions, and pelvic cul-de-sac. It was necessary to proceed with endovaginal exam following the transabdominal exam to visualize the endometrium. COMPARISON:  None FINDINGS: Uterus Measurements: 13.1 x 8.0 x 7.7 cm. There is a 3.5 x 2.7 x 2.2 cm fibroid within the left aspect of the uterine body. Within the lower uterine segment/endocervical canal there is mixed echogenicity complex fluid/soft tissue. Adjacent nabothian cyst. Endometrium Thickness: 22.3 mm.  No focal abnormality visualized. Right ovary Measurements: 4.3 x 2.0 x 3.0 cm. Normal appearance/no adnexal mass. Left ovary Measurements: 6.1 x 6.1 x 6.2 cm. There is a 3.6 x 4.4 x 3.6 cm complex cystic lesion with suggestion of thin internal septation, poorly visualized within the left ovary. Other findings No abnormal free fluid. IMPRESSION: There is mixed echogenicity material expanding the endocervical canal which may represent blood products/hematoma. Endocervical mass is not excluded. Recommend correlation with pelvic examination/direct visualization. Endometrium measures 22 mm. If bleeding remains unresponsive to hormonal or medical therapy, focal lesion work-up with sonohysterogram should be considered. Endometrial biopsy should also be considered in pre-menopausal patients at high risk for endometrial carcinoma. (Ref: Radiological Reasoning: Algorithmic Workup of Abnormal Vaginal Bleeding with Endovaginal Sonography and Sonohysterography. AJR [id number]) Complex cystic lesion within the left ovary with suggestion of thin internal septations. Recommend follow-up pelvic ultrasound in 6-8 weeks to assess for interval change/ resolution. Electronically Signed   By: Lovey Newcomer M.D.   On:  03/27/2016 22:03   US Pelvis Complete  Result Date: 03/27/2016 CLINICAL DATA:  Patient with vaginal bleeding for multiple months. History of irregular periods. EXAM: TRANSABDOMINAL AND TRANSVAGINAL ULTRASOUND OF PELVIS TECHNIQUE: Both transabdominal and transvaginal ultrasound examinations of the pelvis were performed. Transabdominal technique was performed for global imaging of the pelvis including uterus, ovaries, adnexal regions, and pelvic cul-de-sac. It was necessary to proceed with endovaginal exam following the transabdominal exam to visualize the endometrium. COMPARISON:  None FINDINGS: Uterus Measurements: 13.1 x 8.0 x 7.7 cm. There is a 3.5 x 2.7 x 2.2 cm fibroid within the left aspect of the uterine body. Within the lower uterine segment/endocervical canal there is mixed echogenicity complex fluid/soft tissue. Adjacent nabothian cyst. Endometrium Thickness: 22.3 mm.  No focal abnormality visualized. Right ovary Measurements: 4.3 x 2.0 x 3.0 cm. Normal appearance/no adnexal mass. Left ovary Measurements: 6.1 x 6.1 x 6.2 cm. There is a 3.6 x 4.4 x 3.6 cm complex cystic lesion with suggestion of thin internal septation, poorly visualized within the left ovary. Other findings No abnormal free fluid. IMPRESSION: There is mixed echogenicity material expanding the endocervical canal which may represent blood products/hematoma. Endocervical mass is not excluded. Recommend correlation with pelvic examination/direct visualization. Endometrium measures 22 mm. If bleeding remains unresponsive to hormonal or medical therapy, focal lesion work-up with sonohysterogram should be considered. Endometrial biopsy should also be considered in pre-menopausal patients at high risk for endometrial carcinoma. (Ref: Radiological Reasoning: Algorithmic Workup of Abnormal Vaginal Bleeding with Endovaginal Sonography  and Sonohysterography. AJR [id number]) Complex cystic lesion within the left ovary with suggestion of thin  internal septations. Recommend follow-up pelvic ultrasound in 6-8 weeks to assess for interval change/ resolution. Electronically Signed   By: Lovey Newcomer M.D.   On: 03/27/2016 22:03    Microbiology: No results found for this or any previous visit (from the past 240 hour(s)).   Labs: Basic Metabolic Panel:  Recent Labs Lab 03/26/16 2109 03/26/16 2114 03/27/16 0604 03/27/16 2352  NA 142 139 140 136  K 3.4* 3.4* 3.7 3.5  CL 108 109 109 109  CO2  --  25 25 23   GLUCOSE 107* 105* 116* 124*  BUN 10 10 10 10   CREATININE 0.70 0.70 0.60 0.66  CALCIUM  --  7.8* 7.8* 7.6*   Liver Function Tests: No results for input(s): AST, ALT, ALKPHOS, BILITOT, PROT, ALBUMIN in the last 168 hours. No results for input(s): LIPASE, AMYLASE in the last 168 hours. No results for input(s): AMMONIA in the last 168 hours. CBC:  Recent Labs Lab 03/26/16 2109 03/26/16 2114 03/27/16 0604 03/27/16 1900 03/27/16 2352  WBC  --  6.9 8.6  --  10.5  NEUTROABS  --  5.0  --   --   --   HGB 5.4* 5.7* 7.1* 10.1* 9.4*  HCT 16.0* 18.9* 22.7* 31.1* 29.0*  MCV  --  75.0* 78.3  --  78.6  PLT  --  236 209  --  230   Cardiac Enzymes: No results for input(s): CKTOTAL, CKMB, CKMBINDEX, TROPONINI in the last 168 hours. BNP: BNP (last 3 results) No results for input(s): BNP in the last 8760 hours.  ProBNP (last 3 results) No results for input(s): PROBNP in the last 8760 hours.  CBG: No results for input(s): GLUCAP in the last 168 hours.     SignedDomenic Polite MD.  [facility] 03/30/2016, 4:32 PM

## 2016-03-31 ENCOUNTER — Other Ambulatory Visit (HOSPITAL_COMMUNITY)
Admission: RE | Admit: 2016-03-31 | Discharge: 2016-03-31 | Disposition: A | Discharge status: Home / Self Care | Payer: Self-pay | Source: Ambulatory Visit | Attending: Obstetrics and Gynecology | Admitting: Obstetrics and Gynecology

## 2016-03-31 ENCOUNTER — Encounter: Payer: Self-pay | Admitting: Obstetrics and Gynecology

## 2016-03-31 ENCOUNTER — Ambulatory Visit (INDEPENDENT_AMBULATORY_CARE_PROVIDER_SITE_OTHER): Payer: Self-pay | Admitting: Obstetrics and Gynecology

## 2016-03-31 VITALS — BP 139/74 | HR 82 | Wt 242.2 lb

## 2016-03-31 DIAGNOSIS — D252 Subserosal leiomyoma of uterus: Secondary | ICD-10-CM

## 2016-03-31 DIAGNOSIS — N920 Excessive and frequent menstruation with regular cycle: Secondary | ICD-10-CM | POA: Insufficient documentation

## 2016-03-31 DIAGNOSIS — N83292 Other ovarian cyst, left side: Secondary | ICD-10-CM | POA: Insufficient documentation

## 2016-03-31 DIAGNOSIS — N83202 Unspecified ovarian cyst, left side: Secondary | ICD-10-CM

## 2016-03-31 DIAGNOSIS — Z1151 Encounter for screening for human papillomavirus (HPV): Secondary | ICD-10-CM

## 2016-03-31 DIAGNOSIS — D219 Benign neoplasm of connective and other soft tissue, unspecified: Secondary | ICD-10-CM | POA: Insufficient documentation

## 2016-03-31 DIAGNOSIS — Z124 Encounter for screening for malignant neoplasm of cervix: Secondary | ICD-10-CM

## 2016-03-31 IMAGING — CR DG CHEST 1V PORT
1 series · 1 of 1 positions shown · non-contrast
Comparison: None.

CLINICAL DATA: Chest pain

EXAM:
PORTABLE CHEST - 1 VIEW

[AP]
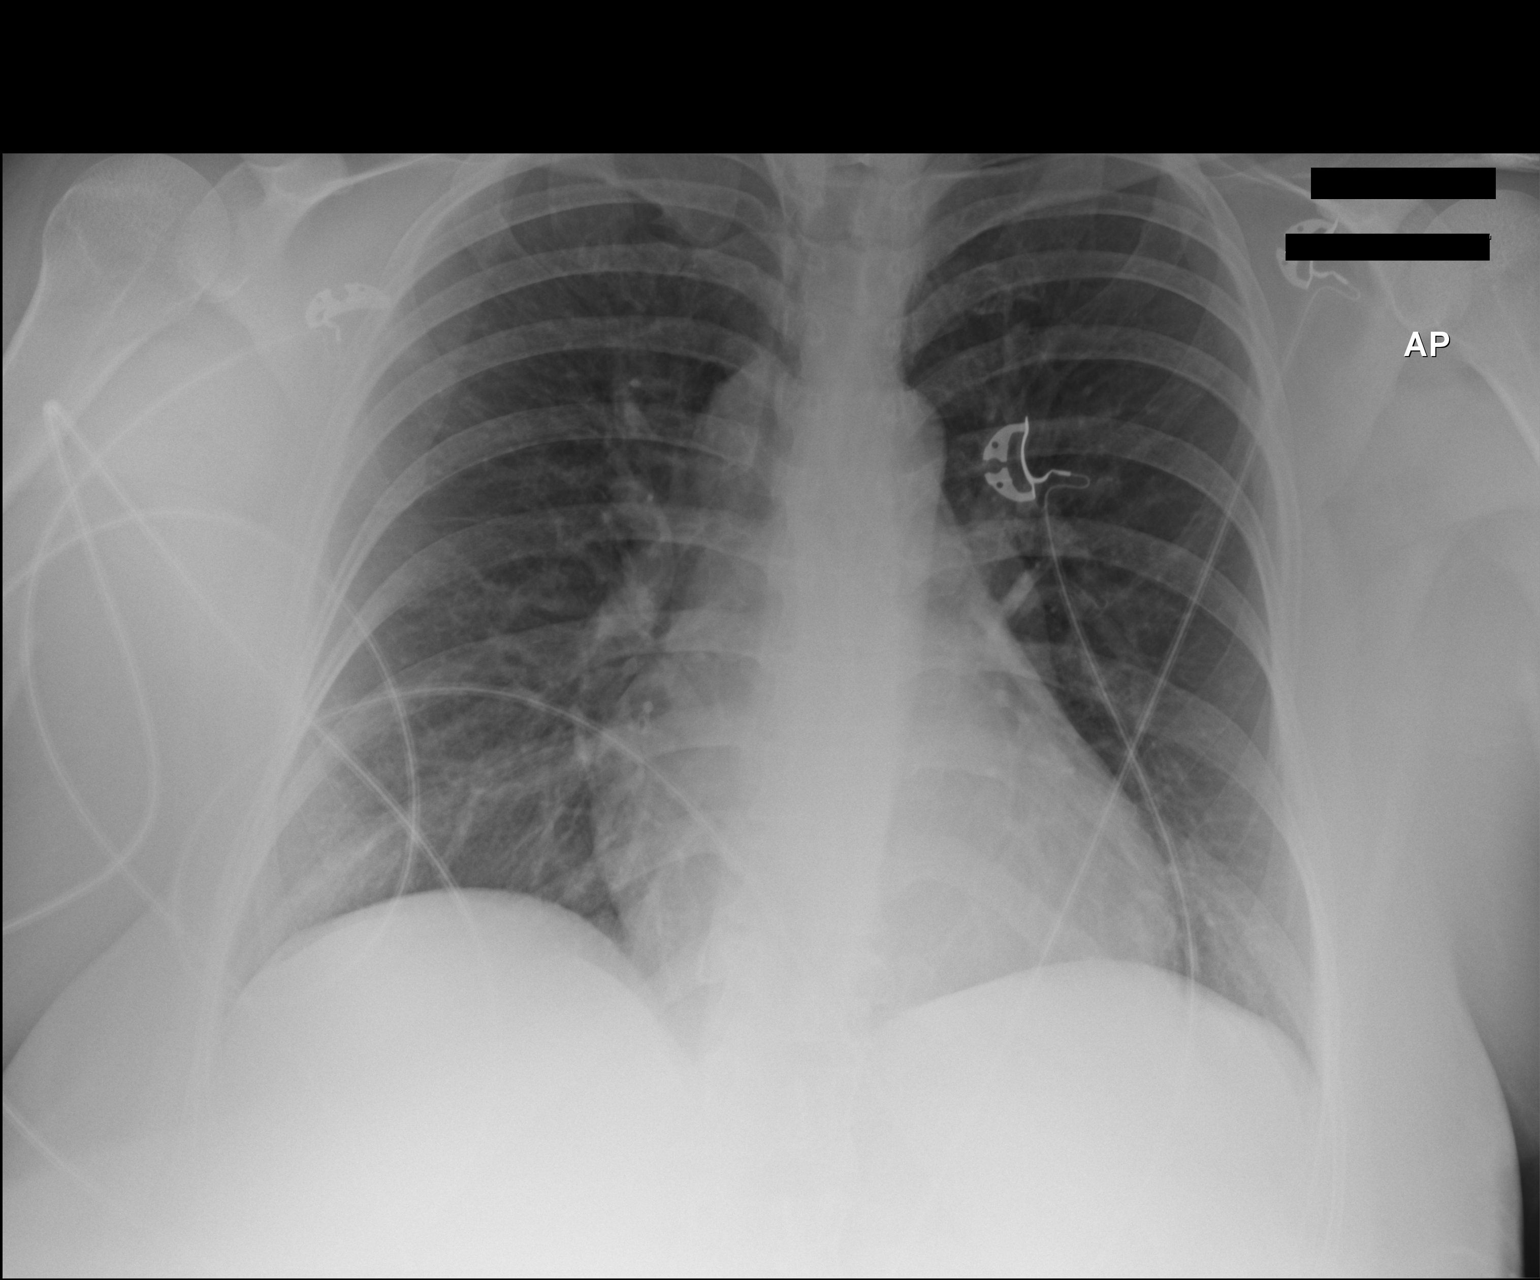

[1 of 1 positions shown; findings below may reference images not displayed]

FINDINGS: The heart size and mediastinal contours are within normal limits.
Both lungs are clear. The visualized skeletal structures are
unremarkable.
IMPRESSION: No active disease.

## 2016-03-31 MED ORDER — MEDROXYPROGESTERONE ACETATE 10 MG PO TABS: 10.0000 mg | ORAL_TABLET | Freq: Every day | ORAL | 2 refills | Status: DC

## 2016-03-31 MED FILL — MEDROXYPROGESTERONE 10 MG T: 10 | 30 days supply | Qty: 30 | Fill #0

## 2016-03-31 NOTE — Progress Notes (Signed)
51 yo [id number] Hispanic female who presents for hospital follow d/t heavy cycles and anemia. Pt reports monthly cycles until this past December. December cycle started on 22 and she continued to have bleeding. Presented to [facility] ER and found to be anemic. Pt was admitted for blood transfusion and U/S.  U/S revealed thicken endometrium, subserosal fibroid and complex left ovarian cyst.   She was started on iron supplement and Provera. Bleeding has now stopped.  She denies any chronic medical problems.  PSH C section secondary to spinal bifida  POB TSVD x 3 largest @ 5 kg ( 10#s)           c section x 1            First trimester AB  SH Denies habits  PE AF VSS Lungs clear Heart RRR Abd soft + BS obese vertical sugerical scar noted Pelvic Nl EGBUS, cervix no lesions, uterus mobile 12 week size, slight left adnexal tenderness  EMBX Indications for [facility] [place] reviewed with pt Informed consent obtained. Recent U/S and UPT negative Speculum placed cervix grasped with single tooth tenaculum Uterus sound to 7 cm Piple inserted and EMBX obtained Pt tolerated well.   A/P Menorrhagia        Uterine fibroid        Anemia secondary to above        Complex Left ovarian cyst  Will continue with Provera and iron supplement. Follow Up with [facility] as scheduled. Await EMBX. F/U U/S for ovarian cyst. RTC in 8 weeks after above completed.

## 2016-03-31 NOTE — Patient Instructions (Signed)
Quiste ovrico (Ovarian Cyst) Un quiste ovrico es una bolsa llena de lquido que se forma en el ovario. Los ovarios son los rganos pequeos que producen vulos en las mujeres. Se pueden formar varios tipos de [employer] Stra[employer]. Algunos pueden provocar sntomas y requerir Clinical research associate. La mayora de los quistes ovricos desaparecen por s solos, no son cancerosos (son benignos) y no causan problemas. Los tipos ms comunes de quistes ovricos son los siguientes:  Quistes funcionales (folculos).  Ocurren durante el ciclo menstrual y suelen desaparecer con el siguiente ciclo menstrual si no queda embarazada.  No suelen causar sntomas.  Endometriomas.  Son quistes formados por el tejido que recubre el tero (endometrio).  A veces se denominan "quistes de chocolate" porque se llenan de sangre que se vuelve marrn.  Este tipo de quiste puede provocar dolor en la zona inferior del abdomen durante la relacin sexual y el perodo menstrual.  Cistoadenomas.  Se desarrollan a partir de clulas que se encuentran en la superficie externa del ovario.  Pueden agrandarse mucho y causar dolor en la zona inferior del abdomen y con la relacin sexual.  Pueden provocar dolor intenso si se tuercen o se rompen (ruptura).  Quistes dermoides.  A veces se encuentran en ambos ovarios.  Estos quistes pueden BJ's tipos de tejidos del organismo, como piel, dientes, pelo o Database administrator.  Generalmente no tienen sntomas, a menos que sean muy grandes.  Quistes tecalutesticos.  Aparecen cuando se produce demasiada cantidad de cierta hormona (gonadotropina corinica humana) que estimula en exceso al ovario para que produzca vulos.  Son ms frecuentes despus de procedimientos que ayudan a la concepcin de un beb (fertilizacin in vitro). CAUSAS Algunas de las causas de los quistes ovricos son las siguientes:  Sndrome de hiperestimulacin ovrica. Esta es una afeccin que puede  aparecer debido a la toma de medicamentos para la fertilidad. Provoca la formacin de varios quistes ovricos grandes.  Sndrome de ovario poliqustico (SOP). Este es un trastorno hormonal frecuente que puede producir quistes ovricos, adems de problemas en el perodo o la fertilidad. Zortman factores pueden hacer que usted sea ms propensa a desarrollar quistes ovricos:  Tener exceso de [place] u obesidad.  Tomar medicamentos para la fertilidad.  Usar ciertas formas de regulacin hormonal de la natalidad.  Fumar. SNTOMAS Muchos quistes ovricos no causan sntomas. Si se presentan sntomas, estos pueden ser:  Dolor o molestias en la pelvis.  Dolor en la parte baja del abdomen.  [place].  [place] abdominal.  Perodos menstruales anormales.  Aumento del [employer] perodos [place]. DIAGNSTICO Estos quistes se descubren comnmente durante un examen de rutina o una exploracin ginecolgica. Puede realizarse exmenes para obtener ms informacin sobre el [place], como los siguientes:  Magazine features editor.  Radiografas de la pelvis.  Tomografa computarizada (TC).  Resonancia magntica (RM).  Anlisis de Jacobus. TRATAMIENTO Muchos de los quistes ovricos desaparecen por s solos, sin tratamiento. Es probable que el mdico quiera controlar el quiste regularmente durante 2 o 57mses para ver si se produce algn cambio. Si est en la menopausia, es especialmente importante controlar el quiste cuidadosamente porque las mujeres menopusicas tienen un ndice mayor de cncer de ovario. Si se necesita tratamiento, este puede incluir lo siguiente:  Tomar medicamentos para aBest boy  Un procedimiento para drenar el quiste (aspiracin).  [place] para extirpar el quiste completo.  Tratamiento hormonal o pldoras anticonceptivas. Estos mtodos a veces se usan para ayudar a dCabin crew  quiste. [place] los medicamentos de venta libre y los recetados solamente como se lo haya indicado el mdico.  No conduzca ni use maquinaria pesada mientras toma analgsicos recetados.  Realcese exmenes plvicos peridicos y pruebas de Papanicolaou con la frecuencia que le indique el mdico.  Retome sus actividades normales como se lo haya indicado el mdico. Pregntele al mdico qu actividades son seguras para usted.  No consuma ningn producto que contenga tabaco o nicotina, como cigarrillos y Psychologist, sport and exercise. Si necesita ayuda para dejar de fumar, consulte al mdico.  Concurra a todas las visitas de control como se lo haya indicado el mdico. Esto es importante. SOLICITE ATENCIN MDICA SI:  Los perodos se atrasan, son irregulares, dolorosos o cesan.  Tiene dolor plvico que no desaparece.  Siente presin en la vejiga o no puede vaciarla completamente.  Siente dolor durante las [employer].  Tiene alguno de los siguientes sntomas en el abdomen:  Sensacin de tener el estmago lleno.  Presin.  Molestias.  Dolor que persiste.  Hinchazn.  Siente un Pharmacist, hospital.  Tiene estreimiento.  Pierde el apetito.  Presenta acn grave.  Empieza a tener ms bello corporal y facial.  Aumenta o disminuye de peso sin hacer modificaciones en su actividad fsica y en su dieta habitual.  Cree que puede estar [place]. SOLICITE ATENCIN MDICA DE INMEDIATO SI:  Tiene un dolor abdominal intenso o que empeora.  No puede comer ni beber sin vomitar.  Tiene fiebre de [place] repentina.  Su perodo menstrual es mucho ms profuso que lo normal. Esta informacin no tiene Marine scientist el consejo del mdico. Asegrese de hacerle al mdico cualquier pregunta que tenga. Document Released: 11/27/2004 Document Revised: 02/22/2013 Document Reviewed: 07/22/2015 Elsevier Interactive Patient Education  2017 [place] (Menorrhagia) Se  llama menorragia a los perodos menstruales abundantes o que duran ms de lo habitual. En la menorragia, la prdida de [place] y los clicos en cada perodo pueden hacerle imposible seguir con sus actividades habituales. CAUSAS En algunos casos, la causa de los perodos abundantes es desconocida, pero hay algunas afecciones que pueden causar Psychologist, educational. Las causas ms frecuentes son:  Un problema con la tiroides, que es la glndula productora de hormonas (hipotiroidismo).  Formaciones no cancerosas en el tero (plipos o fibromas).  Un desequilibrio entre las hormonas estrgeno y Immunologist.  Uno de sus ovarios no libera vulos durante uno o ms meses.  Efectos secundarios por haberse colocado un dispositivo intrauterino (DIU).  Efectos secundarios por algunos medicamentos, como antiinflamatorios o anticoagulantes.  Trastornos hemorrgicos que impiden la Transport planner. SIGNOS Y SNTOMAS Durante un perodo normal, el sangrado dura entre 4 y 87 das. Los signos de que el perodo es muy abundante son:  [place] rutinaria tiene que cambiar el apsito o el tampn cada 1 o 2 horas debido a que est completamente empapado.  Elimina cogulos ms grandes de 1 pulgada (2,5 cm).  Tiene sangrado durante ms de 7 das.  Necesita usar apsitos y tampones al mismo tiempo porque pierde [employer].  Debe levantarse para cambiarse el apsito o el tampn durante la noche.  Tiene sntomas de anemia como cansancio, fatiga o falta de aire. DIAGNSTICO El Viacom har un examen fsico y le har preguntas sobre sus sntomas y su historia menstrual. Podr indicarle otros estudios segn lo que encuentre Social worker. Estos estudios pueden ser:  Anlisis de [place]. Los anlisis de sangre se usan para verificar si  est embarazada o tiene cambios hormonales, un trastorno tiroideo o de sangrado, niveles bajos de hierro (anemia) u otros problemas.  Biopsia de endometrio. El mdico tomar  [place] de tejido del interior del tero para que sea examinado con un microscopio.  Ecografa plvica. Este estudio [place] ondas de sonido para tomar imgenes del tero, los ovarios y Geneticist, molecular. Las imgenes pueden mostrar si tiene fibromas u otros crecimientos.  [place]. Para este estudio, el mdico usar un pequeo telescopio para Chemical engineer interior del tero. Segn los Sears Holdings Corporation estudios iniciales, el mdico podr indicar ms Charter Communications. TRATAMIENTO Puede ser que no sea necesario un tratamiento mdico. Si lo necesita, el mdico primero podr recomendarle un tratamiento con uno o ms medicamentos. Si no se reduce el sangrado lo suficiente, el tratamiento quirrgico podra ser [employer]. El mejor tratamiento para usted depender de:  Si necesita evitar un embarazo.  Si desea tener hijos en el futuro.  La causa y la gravedad del sangrado.  Su opinin o preferencia personal. Algunos medicamentos para la menorragia son:  Mtodos anticonceptivos que contengan hormonas. Estos incluyen la pldora anticonceptiva, el parche en la piel, el anillo vaginal, las inyecciones que se aplican cada 3 meses, el DIU hormonal y el implante. Estos tratamientos reducen el sangrado durante el perodo menstrual.  Medicamentos que espesan la sangre y hacen ms lento el sangrado.  Medicamentos que reducen la inflamacin, como el ibuprofeno.  Medicamentos que contienen una hormona sinttica llamada progestina.  Medicamentos que The First American ovarios dejen de funcionar durante un breve lapso. Podra ser necesario un tratamiento quirrgico para la menorragia si los medicamentos no son eficaces. Las opciones de tratamiento incluyen:  Dilatacin y curetaje (D y C). En este procedimiento, el mdico abre (dilata) el cuello del tero y luego raspa o succiona tejido del revestimiento interior del tero para reducir el sangrado menstrual.  [place] quirrgica. En este procedimiento, se utiliza un  pequeo tubo con Hali Marry (histeroscopio) para observar la cavidad uterina y ayudar en la extirpacin quirrgica de un plipo que puede ser la causa de perodos abundantes.  Ablacin del endometrio. Por medio de Johnson & Johnson, el mdico destruye de [place] todo el revestimiento interno del tero (endometrio). Luego de la ablacin del endometrio, la mayora de las mujeres tienen escaso flujo menstrual, o no lo tienen. La ablacin del endometrio reduce la posibilidad de quedar embarazada.  Reseccin del endometrio. En este procedimiento quirrgico, se [place] un asa de Environmental manager para extirpar el revestimiento interno del tero. Este procedimiento tambin reduce la posibilidad de [place].  Histerectoma. La remocin [place] del tero y el cuello del tero es un procedimiento permanente que detiene los perodos [place]. El embarazo no es posible luego de Physicist, medical. Este procedimiento requiere de anestesia y hospitalizacin. [place] solo medicamentos de venta libre o recetados, segn las indicaciones del [place] todos los medicamentos recetados exactamente como se le indic. No cambie ni reemplace los medicamentos sin consultarlo con el mdico.  Tome los comprimidos de hierro recetados, Scientist, water quality segn las indicaciones del mdico. Las hemorragias de larga duracin pueden traer como consecuencia una disminucin en los niveles de hierro. Los comprimidos de hierro ayudan a Camera operator hierro que el organismo pierde luego de un sangrado abundante. El hierro puede causarle estreimiento. Si esto es un problema, aumente el consumo de [place], frutas y [place].  No tome aspirina ni medicamentos que contengan aspirina desde 1 semana antes  ni durante el perodo menstrual. La aspirina puede hacer que la hemorragia empeore.  Si necesita cambiar el apsito o el tampn ms de una vez cada 2horas, Nature conservation officer en cama y  descanse todo lo posible hasta que la hemorragia se detenga.  Siga una dieta balanceada. Consuma alimentos ricos en hierro. Por ejemplo, vegetales de Boeing, carne, hgado, huevos y panes y Actor de grano entero. No trate de perder peso hasta que la hemorragia anormal se detenga y los niveles de hierro en la sangre vuelvan a la normalidad. SOLICITE ATENCIN MDICA SI:  Empapa un tampn o un apsito cada 1 o 2 horas, y UGI Corporation ocurre cada vez que tiene el perodo.  Necesita usar apsitos y tampones al mismo tiempo porque pierde [employer].  Debe cambiarse el apsito o el tampn durante la noche.  Tiene un perodo que dura ms de 8 das.  Elimina cogulos de ms de 1 pulgada (2,5 cm).  Tiene perodos irregulares que ocurren ms o menos de una vez al mes.  Se siente mareada o se desmaya.  Se siente muy dbil o cansada.  Le falta el aire o siente que el corazn late muy rpido al hacer [place].  Tiene nuseas y vmitos o diarrea mientras toma los medicamentos.  Tiene algn problema que puede estar relacionado con el medicamento que est tomando. SOLICITE ATENCIN MDICA DE INMEDIATO SI:  Empapa 4 o ms apsitos o tampones en 2 horas.  Tiene sangrado y est embarazada. ASEGRESE DE QUE:  Comprende estas instrucciones.  Controlar su afeccin.  Recibir ayuda de inmediato si no mejora o si empeora. Esta informacin no tiene Marine scientist el consejo del mdico. Asegrese de hacerle al mdico cualquier pregunta que tenga. Document Released: 11/27/2004 Document Revised: 06/11/2015 Document Reviewed: 08/08/2012 Elsevier Interactive Patient Education  2017 [employer].

## 2016-04-02 ENCOUNTER — Encounter: Payer: Self-pay | Admitting: Physician Assistant

## 2016-04-02 ENCOUNTER — Ambulatory Visit: Payer: Self-pay | Attending: Internal Medicine | Admitting: Physician Assistant

## 2016-04-02 VITALS — BP 160/105 | HR 82 | Temp 98.1°F | Resp 165 | Wt 240.0 lb

## 2016-04-02 DIAGNOSIS — N921 Excessive and frequent menstruation with irregular cycle: Secondary | ICD-10-CM

## 2016-04-02 DIAGNOSIS — D62 Acute posthemorrhagic anemia: Secondary | ICD-10-CM

## 2016-04-02 DIAGNOSIS — D509 Iron deficiency anemia, unspecified: Secondary | ICD-10-CM | POA: Insufficient documentation

## 2016-04-02 LAB — CBC WITH DIFFERENTIAL/PLATELET
Basophils Absolute: 0 cells/uL (ref 0–200)
Basophils Relative: 0 %
EOS PCT: 1 %
Eosinophils Absolute: 64 cells/uL (ref 15–500)
HCT: 33.7 % — ABNORMAL LOW (ref 35.0–45.0)
HEMOGLOBIN: 10.6 g/dL — AB (ref 11.7–15.5)
LYMPHS ABS: 1408 {cells}/uL (ref 850–3900)
Lymphocytes Relative: 22 %
MCH: 25.8 pg — ABNORMAL LOW (ref 27.0–33.0)
MCHC: 31.5 g/dL — AB (ref 32.0–36.0)
MCV: 82 fL (ref 80.0–100.0)
MONOS PCT: 6 %
MPV: 10.5 fL (ref 7.5–12.5)
Monocytes Absolute: 384 cells/uL (ref 200–950)
NEUTROS ABS: 4544 {cells}/uL (ref 1500–7800)
Neutrophils Relative %: 71 %
PLATELETS: 214 10*3/uL (ref 140–400)
RBC: 4.11 MIL/uL (ref 3.80–5.10)
RDW: 20.6 % — ABNORMAL HIGH (ref 11.0–15.0)
WBC: 6.4 10*3/uL (ref 3.8–10.8)

## 2016-04-02 NOTE — Progress Notes (Signed)
Sarah Norman  [id number]  [id number]  DOB - Dec 26, 1965  Chief Complaint  Patient presents with  . Hospitalization Follow-up       Subjective:   Sarah Norman is a 51 y.o. female here today for establishment of care. She has no significant past medical history. On 03/12/2016 she presented to the emergency department with vaginal bleeding. She was placed on Provera, 10 mg for 5 days. Her symptoms worsen. She will return to the emergency room on 03/26/2016 and her hemoglobin at that time was 5.4. She was passing clots. She was fatigued, lightheaded and weak. She was going through at least 10 thick pads per day. Vital signs were stable. She was admitted and given 4 units of packed red blood cells. She saw the GYN and had a pelvic ultrasound which showed a thickened endometrium with a left ovarian cyst. She was placed on a extended amount of Provera with iron. She has seen Dr. Rip Harbour in follow-up since her dismissal. She had an endometrial biopsy and the results are pending.  She is doing well otherwise. No evidence of bleeding. Appetite okay. No new complaints.  ROS: GEN: denies fever or chills, denies change in weight Skin: denies lesions or rashes HEENT: denies headache, earache, epistaxis, sore throat, or neck pain LUNGS: denies SHOB, dyspnea, PND, orthopnea CV: denies CP or palpitations ABD: denies abd pain, N or V EXT: denies muscle spasms or swelling; no pain in lower ext, no weakness NEURO: denies numbness or tingling, denies sz, stroke or TIA  ALLERGIES: No Known Allergies  PAST MEDICAL HISTORY: Past Medical History:  Diagnosis Date  . Anemia   . History of blood transfusion 03/27/2016   "related to anemia"  . Obesity   . Seasonal asthma    "pollen"    PAST SURGICAL HISTORY: Past Surgical History:  Procedure Laterality Date  . CESAREAN SECTION  1993  . TUBAL LIGATION  ~ [place]: Prior to Admission medications   Medication Sig Start  Date End Date Taking? Authorizing Provider  ferrous sulfate 325 (65 FE) MG tablet Take 1 tablet (325 mg total) by mouth 2 (two) times daily with a meal. 03/28/16   Domenic Polite, MD  medroxyPROGESTERone (PROVERA) 10 MG tablet Take 1 tablet (10 mg total) by mouth daily. Take until Gyn FOllow Up 03/31/16   Chancy Milroy, MD     Objective:   Vitals:   04/02/16 1544  BP: (!) 160/105  Pulse: 82  Resp: (!) 165  Temp: 98.1 F (36.7 C)  TempSrc: Oral  SpO2: 100%  Weight: 240 lb (108.9 kg)   Exam-benign General appearance : Awake, alert, not in any distress. Speech Clear. Not toxic looking HEENT: Atraumatic and Normocephalic, pupils equally reactive to light and accomodation Neck: supple, no JVD. No cervical lymphadenopathy.  Chest:Good air entry bilaterally, no added sounds  CVS: S1 S2 regular, no murmurs.  Abdomen: Bowel sounds present, Non tender and not distended with no gaurding, rigidity or rebound. Extremities: B/L Lower Ext shows no edema, both legs are warm to touch Neurology: Awake alert, and oriented X 3, CN II-XII intact, Non focal Skin:No Rash Wounds:N/A   Assessment & Plan  1. Acute blood loss anemia 2/2 #2  -CBC today  2. Menorraghia  -keep f/u with Dr. Rip Harbour  -Cont Provera  3. Fe def anemia  -cont Fe supplements  -increase hydration and fiber   Return in about 2 weeks (around 04/16/2016).  The patient was given clear  instructions to go to ER or return to medical center if symptoms don't improve, worsen or new problems develop. The patient verbalized understanding. The patient was told to call to get lab results if they haven't heard anything in the next week.   This note has been created with Surveyor, quantity. Any transcriptional errors are unintentional.    Zettie Pho, PA-C [facility] and [facility] [place], [place]   04/02/2016, 3:58 PM

## 2016-04-03 LAB — CYTOLOGY - PAP
DIAGNOSIS: NEGATIVE
HPV (WINDOPATH): NOT DETECTED

## 2016-04-07 ENCOUNTER — Telehealth: Payer: Self-pay

## 2016-04-07 NOTE — Telephone Encounter (Signed)
Left a message for patient to call us back regarding lab results.

## 2016-04-07 NOTE — Telephone Encounter (Signed)
-----   Message from Chancy Milroy, MD sent at 04/07/2016  8:29 AM EST ----- Please let Ms Enevoldsen that her pap smear and EMBX were negative. Continue with Provera daily. Keep F/U GYN U/S and make follow up appt with me about 1-2 weeks after U/S Thanks Legrand Como

## 2016-04-09 NOTE — Telephone Encounter (Signed)
Called patient at both listed numbers, no answer- left message to call us back regarding results

## 2016-04-10 NOTE — Telephone Encounter (Signed)
Patient called into front office & I informed her of results, to take provera daily & reminded her of ultrasound appt. Told patient she will need an office appt to follow ultrasound and that if she hasn't heard from the front office by March to call for an appt. Patient verbalized understanding to all & had no questions

## 2016-04-16 ENCOUNTER — Encounter: Payer: Self-pay | Admitting: Family Medicine

## 2016-04-16 ENCOUNTER — Inpatient Hospital Stay (HOSPITAL_COMMUNITY)
Admission: AD | Admit: 2016-04-16 | Discharge: 2016-04-16 | Disposition: A | Discharge status: Home / Self Care | Payer: Self-pay | Source: Ambulatory Visit | Attending: Obstetrics and Gynecology | Admitting: Obstetrics and Gynecology

## 2016-04-16 ENCOUNTER — Encounter (HOSPITAL_COMMUNITY): Payer: Self-pay | Admitting: *Deleted

## 2016-04-16 ENCOUNTER — Ambulatory Visit: Payer: Self-pay | Attending: Family Medicine | Admitting: Family Medicine

## 2016-04-16 VITALS — BP 127/79 | HR 84 | Temp 97.8°F | Resp 18 | Ht 61.0 in | Wt 237.0 lb

## 2016-04-16 DIAGNOSIS — Z3202 Encounter for pregnancy test, result negative: Secondary | ICD-10-CM | POA: Insufficient documentation

## 2016-04-16 DIAGNOSIS — Z86018 Personal history of other benign neoplasm: Secondary | ICD-10-CM

## 2016-04-16 DIAGNOSIS — N946 Dysmenorrhea, unspecified: Secondary | ICD-10-CM

## 2016-04-16 DIAGNOSIS — N9489 Other specified conditions associated with female genital organs and menstrual cycle: Secondary | ICD-10-CM

## 2016-04-16 DIAGNOSIS — N926 Irregular menstruation, unspecified: Secondary | ICD-10-CM | POA: Insufficient documentation

## 2016-04-16 DIAGNOSIS — Z862 Personal history of diseases of the blood and blood-forming organs and certain disorders involving the immune mechanism: Secondary | ICD-10-CM

## 2016-04-16 DIAGNOSIS — N939 Abnormal uterine and vaginal bleeding, unspecified: Secondary | ICD-10-CM

## 2016-04-16 LAB — URINALYSIS, ROUTINE W REFLEX MICROSCOPIC

## 2016-04-16 LAB — POCT PREGNANCY, URINE: Preg Test, Ur: NEGATIVE

## 2016-04-16 LAB — CBC
HEMATOCRIT: 33.8 % — AB (ref 36.0–46.0)
Hemoglobin: 10.9 g/dL — ABNORMAL LOW (ref 12.0–15.0)
MCH: 26.6 pg (ref 26.0–34.0)
MCHC: 32.2 g/dL (ref 30.0–36.0)
MCV: 82.4 fL (ref 78.0–100.0)
Platelets: 271 10*3/uL (ref 150–400)
RBC: 4.1 MIL/uL (ref 3.87–5.11)
RDW: 19.6 % — ABNORMAL HIGH (ref 11.5–15.5)
WBC: 6.6 10*3/uL (ref 4.0–10.5)

## 2016-04-16 LAB — URINALYSIS, MICROSCOPIC (REFLEX)
Bacteria, UA: NONE SEEN
WBC, UA: NONE SEEN WBC/hpf (ref 0–5)

## 2016-04-16 MED ORDER — MEGESTROL ACETATE 40 MG PO TABS: 40.0000 mg | ORAL_TABLET | Freq: Three times a day (TID) | ORAL | 0 refills | Status: DC

## 2016-04-16 MED ORDER — IBUPROFEN 800 MG PO TABS: 800.0000 mg | ORAL_TABLET | Freq: Three times a day (TID) | ORAL | 0 refills | Status: DC | PRN

## 2016-04-16 NOTE — Progress Notes (Signed)
   Subjective:  Patient ID: Sarah Norman, female    DOB: 22-Aug-1965  Age: 51 y.o. MRN: [id number]  CC: Establish Care   HPI Sarah Norman presents for complaints of uterine bleeding. She reports being prescribed a medication to help stop the uterine bleeding from her gynecologist. She reports however bleeding started again last Thursday. She reports calling the gynecologist office last Friday and received an appointment for March 13 but was told if symptoms worsened to go to [facility] ED. She denies any chest pain, shortness of breath, dizziness, or fatigue. She reports using 3 menstrual pads today.    Outpatient Medications Prior to Visit  Medication Sig Dispense Refill  . ferrous sulfate 325 (65 FE) MG tablet Take 1 tablet (325 mg total) by mouth 2 (two) times daily with a meal. 60 tablet 0  . medroxyPROGESTERone (PROVERA) 10 MG tablet Take 1 tablet (10 mg total) by mouth daily. Take until Gyn FOllow Up 30 tablet 2   No facility-administered medications prior to visit.     ROS Review of Systems  Respiratory: Negative.   Cardiovascular: Negative.   Gastrointestinal: Positive for abdominal pain.  Genitourinary: Positive for vaginal bleeding.  Neurological: Negative.     Objective:  BP 127/79 (BP Location: Right Arm, Patient Position: Sitting, Cuff Size: Normal)   Pulse 84   Temp 97.8 F (36.6 C) (Oral)   Resp 18   Ht 5\' 1"  (1.549 m)   Wt 237 lb (107.5 kg)   LMP 04/10/2016   SpO2 99%   BMI 44.78 kg/m   BP/Weight 04/16/2016 04/16/2016 [id number]  Systolic BP [id number]  Diastolic BP 84 79 [id number]  Wt. (Lbs) 235 237 240  BMI 44.4 44.78 45.35     Physical Exam  HENT:  Head: Normocephalic.  Mouth/Throat: Oropharynx is clear and moist and mucous membranes are normal.  Cardiovascular: Normal rate, regular rhythm, normal heart sounds and intact distal pulses.   Pulmonary/Chest: Effort normal and breath sounds normal.  Abdominal: Soft. Bowel sounds are normal. There is  tenderness (pelvic areaa).  Nursing note and vitals reviewed.    Assessment & Plan:   Problem List Items Addressed This Visit    -Patient was encouraged to follow-up with gynecology for earlier appointment,  if symptoms worsen          go to [facility] ED.       -Follow up with gynecologist and their recommendations.  Visit Diagnoses    Irregular menstrual bleeding    -  Primary   Relevant Orders   CBC with Differential (Completed)   History of uterine fibroid       History of anemia       Relevant Orders   CBC with Differential (Completed)   Menstrual cramps       Relevant Medications   ibuprofen (ADVIL,MOTRIN) 800 MG tablet      Meds ordered this encounter  Medications  . ibuprofen (ADVIL,MOTRIN) 800 MG tablet    Sig: Take 1 tablet (800 mg total) by mouth every 8 (eight) hours as needed for moderate pain or cramping.    Dispense:  30 tablet    Refill:  0    Order Specific Question:   Supervising Provider    Answer:   Tresa Garter [id number]     Follow-up: Return As needed.    Alfonse Spruce FNP

## 2016-04-16 NOTE — Patient Instructions (Addendum)
Call and Follow up with her gynecologist and their recommendations. Go to [facility] ED if bleeding persists.  Anemia inespecfica (Anemia, Nonspecific) La anemia es una enfermedad en la que la concentracin de glbulos rojos o el nivel de hemoglobina en la sangre estn por debajo de lo normal. La hemoglobina es la sustancia de los glbulos rojos que lleva el oxgeno a todo el cuerpo. La anemia da como resultado que los tejidos no reciban la cantidad suficiente de oxgeno. CAUSAS Las causas ms frecuentes de anemia son:  Sarah Norman. El sangrado puede ser interno o externo. Incluye sangrado excesivo debido al perodo (en las mujeres) o por los intestinos.  Dficit nutricional.  Enfermedad renal, tiroidea o heptica crnicas.  Enfermedades de la mdula sea que disminuyen la produccin de glbulos rojos.  Cncer y tratamientos para Science writer.  VIH, sida y sus tratamientos.  Trastornos del bazo que aumentan la destruccin de glbulos rojos.  Enfermedades de [employer].  Destruccin excesiva de glbulos rojos debido a una infeccin, a medicamentos y a Nurse, mental health. SIGNOS Y SNTOMAS  Debilidad leve.  Mareos.  Dolor de [place].  Palpitaciones.  Falta de aire, especialmente con el ejercicio.  Palidez.  Sensibilidad al fro.  Indigestin.  Nuseas.  Dificultad para dormir.  Dificultad para concentrarse. Los sntomas pueden ocurrir repentinamente o pueden Psychologist, forensic. DIAGNSTICO Con frecuencia es necesario realizar anlisis de [employer]. Estos ayudan al profesional a Adult nurse. Su mdico controlar la materia fecal para Hydrographic surveyor la presencia de [place] y buscar otras causas de prdida de [place]. TRATAMIENTO El tratamiento vara segn la causa de la anemia. Las opciones de tratamiento son:  Suplementos de hierro, vitamina [id number], o cido flico.  Medicamentos con hormonas.  Transfusin de [place]. Ser  necesaria en los casos de prdida de [place] grave.  Hospitalizacin. Ser necesaria si la prdida de sangre es continua y significativa.  Cambios en la dieta.  Extirpacin del bazo. INSTRUCCIONES PARA EL CUIDADO EN EL HOGAR Cumpla con todas las visitas de control. Generalmente demora varias semanas corregir la anemia, y es muy importante que el mdico controle su enfermedad y su respuesta al [place]. SOLICITE ATENCIN MDICA DE INMEDIATO SI:  Siente debilidad extrema, falta de aire o dolor en el pecho.  Se siente mareado o tiene dificultad para concentrarse.  Tiene una hemorragia vaginal abundante.  Aparece una erupcin cutnea.  La materia fecal es negra, de aspecto alquitranado.  Se desmaya.  Vomita sangre.  Vomita repetidas veces.  Siente dolor abdominal.  Tiene fiebre o sntomas persistentes durante ms de 2 - 3 das.  Tiene fiebre y los sntomas empeoran repentinamente.  Se deshidrata. ASEGRESE DE QUE:  Comprende estas instrucciones.  Controlar su afeccin.  Recibir ayuda de inmediato si no mejora o si empeora. Esta informacin no tiene Marine scientist el consejo del mdico. Asegrese de hacerle al mdico cualquier pregunta que tenga. Document Released: 02/17/2005 Document Revised: 10/20/2012 Document Reviewed: 08/13/2012 Elsevier Interactive Patient Education  2017 [place] (Menorrhagia) Se llama menorragia a los periodos menstruales abundantes o que duran ms de lo habitual. CUIDADOS EN EL HOGAR  Slo tome los medicamentos que le haya indicado el mdico.  Tome los comprimidos de hierro segn las indicaciones del mdico. El sangrado abundante puede causar bajos niveles de hierro en el organismo.  Notome aspirina desde 1 semana antes ni durante el perodo menstrual. La aspirina puede hacer que la hemorragia empeore.  Recustese un rato si debe cambiar el  tampn o el apsito ms de una vez en 2 horas. Esto puede ayudar a  Engineer, maintenance.  Consuma una dieta saludable y alimentos ricos en hierro. Entre estos alimentos se incluyen vegetales de [place], carne, [place], [place] y panes y Actor de grano entero.  No trate de perder peso. Espere hasta que la hemorragia se haya detenido y sus niveles de hierro vuelvan a ser normales. SOLICITE AYUDA SI:  Empapa un tampn o un apsito cada 1 o 2 horas, y [employer] ocurre cada vez que tiene el perodo.  Necesita usar apsitos y tampones al mismo tiempo porque pierde [employer].  Debe cambiarse el apsito o el tampn durante la noche.  Tiene un perodo que dura ms de 8 das.  Elimina cogulos ms grandes que 1 pulgada (2,5 cm).  Tiene perodos irregulares que ocurren ms o menos de una vez al mes.  Se siente mareada o se desvanece (se desmaya).  Se siente muy dbil o cansada.  Le falta el aire o siente que el corazn late muy rpido al hacer [place].  Tiene ganas de vomitar ( nuseas ) y devuelve ( vomita ) mientras est tomando el medicamento.  Tiene heces acuosas (diarrea) mientras toma los medicamentos.  Tiene algn problema que pueda relacionarse con el medicamento que est tomando. SOLICITE AYUDA DE INMEDIATO SI:  Empapa 4 o ms apsitos o tampones en 2 horas.  Tiene sangrado y est embarazada. ASEGRESE DE QUE:  Comprende estas instrucciones.  Controlar su afeccin.  Recibir ayuda de inmediato si no mejora o si empeora. Esta informacin no tiene Marine scientist el consejo del mdico. Asegrese de hacerle al mdico cualquier pregunta que tenga. Document Released: 03/22/2010 Document Revised: 10/20/2012 Document Reviewed: 08/19/2012 Elsevier Interactive Patient Education  2017 [employer].

## 2016-04-16 NOTE — MAU Note (Signed)
Pt presents to MAU with complaints of heavy vaginal bleeding for 3 days. Reports lower abdominal cramping. Pt was evaluated at physician's office today and was told to come in

## 2016-04-16 NOTE — Discharge Instructions (Signed)
Sangrado uterino anormal °(Abnormal Uterine Bleeding) °Sangrado uterino anormal significa que hay un sangrado por la vagina que no es su período menstrual normal. Puede ser: °· Pérdidas de sangre o hemorragias entre los períodos. °· Hemorragias luego de tener sexo (relaciones sexuales). °· Sangrado abundante o más que lo habitual. °· Períodos que duran más que lo normal. °· Sangrado luego de la menopausia. °Hay muchos problemas que pueden ser la causa. El tratamiento dependerá de la causa del sangrado. Cualquier tipo de sangrado que no sea normal debe consultarse con el médico. °CUIDADOS EN EL HOGAR °Controle su afección para ver si hay cambios. Estas indicaciones podrán disminuir cualquier molestia que tenga: °· No use tampones ni duchas vaginales o como le haya indicado el médico. °· Cambie los apósitos con frecuencia. °Deberá hacerse exámenes pélvicos regulares y pruebas de [place]. Realice los estudios indicados según le indique su médico. °SOLICITE AYUDA SI: °· El sangrado dura más de 1 semana. °· Se siente mareada por momentos. ° °SOLICITE AYUDA DE INMEDIATO SI: °· Se desmaya. °· Tiene que cambiarse los apósitos cada 15 a 30 minutos. °· Siente dolor en el abdomen. °· Tiene fiebre. °· Se siente débil o presenta sudoración. °· Elimina coágulos grandes por la vagina. °· Siente malestar estomacal (náuseas) y devuelve (vomita). ° °ASEGÚRESE DE QUE: °· Comprende estas instrucciones. °· Controlará su afección. °· Recibirá ayuda de inmediato si no mejora o si empeora. ° °Esta información no tiene como fin reemplazar el consejo del médico. Asegúrese de hacerle al médico cualquier pregunta que tenga. °Document Released: 03/22/2010 Document Revised: 02/22/2013 Document Reviewed: 09/16/2012 °Elsevier Interactive Patient Education © 2017 Elsevier Inc. ° °

## 2016-04-16 NOTE — MAU Provider Note (Signed)
History     CSN: [id number]  Arrival date and time: 04/16/16 1753   First Provider Initiated Contact with Patient 04/16/16 1934      Chief Complaint  Patient presents with  . Vaginal Bleeding   Non-pregnant female here with heavy vaginal bleeding. Bleeding started 6 days ago but has been heavy x3 days. She is changing a pad every hour and using a diaper for protection as well. She also reports heavy flow into toilet and passing fist sized clots. She also reports uterine cramping. She rates pain 9/10. She has not used anything for the pain. Review of the chart shows she has been seen in the ED twice since last month for the bleeding. Initially she was given Provera for 5 days on 03/12/16. On the second ED visit on 03/26/16 she was found to be anemia with a Hgb of 5.7. She was transfused 4 units and started on Premarin and Megace. She was seen in the [facility] about 2 weeks ago for EBX and f/u. Bleeding had stopped at that point. She was started on Provera 10 mg daily and EBX was negative. She had a pelvic US on 03/27/16 which showed a submucosal fibroid, thickened endometrium, and complex left ovarian cyst.    Past Medical History:  Diagnosis Date  . Anemia   . History of blood transfusion 03/27/2016   "related to anemia"  . Obesity   . Seasonal asthma    "pollen"    Past Surgical History:  Procedure Laterality Date  . CESAREAN SECTION  1993  . TUBAL LIGATION  ~ 1995    Family History  Problem Relation Age of Onset  . Diabetes Mellitus II Father     Social History  Substance Use Topics  . Smoking status: Never Smoker  . Smokeless tobacco: Never Used  . Alcohol use No    Allergies: No Known Allergies  Prescriptions Prior to Admission  Medication Sig Dispense Refill Last Dose  . ferrous sulfate 325 (65 FE) MG tablet Take 1 tablet (325 mg total) by mouth 2 (two) times daily with a meal. 60 tablet 0 04/16/2016 at Unknown time  . ibuprofen (ADVIL,MOTRIN) 200 MG tablet Take 400 mg by  mouth every 6 (six) hours as needed for moderate pain or cramping.   04/15/2016 at Unknown time  . medroxyPROGESTERone (PROVERA) 10 MG tablet Take 1 tablet (10 mg total) by mouth daily. Take until Gyn FOllow Up 30 tablet 2 04/15/2016 at Unknown time  . ibuprofen (ADVIL,MOTRIN) 800 MG tablet Take 1 tablet (800 mg total) by mouth every 8 (eight) hours as needed for moderate pain or cramping. (Patient not taking: Reported on 04/16/2016) 30 tablet 0 Not Taking at Unknown time    Review of Systems  Constitutional: Negative for fatigue.  Genitourinary: Positive for vaginal bleeding.  Neurological: Negative for dizziness and light-headedness.   Physical Exam   Blood pressure 157/93, pulse 87, temperature 97.9 F (36.6 C), resp. rate 18, weight 106.6 kg (235 lb), last menstrual period 04/10/2016.  Physical Exam  Constitutional: She is oriented to person, place, and time. She appears well-developed and well-nourished. No distress (appears comfortable).  HENT:  Head: Normocephalic and atraumatic.  Neck: Normal range of motion.  Cardiovascular: Normal rate.   Respiratory: Effort normal.  GI: Soft. She exhibits no distension and no mass. There is no tenderness. There is no rebound and no guarding.  Genitourinary:  Genitourinary Comments: External: no lesions or erythema Vagina: rugated, parous, small red bloody discharge Uterus: enlarged,  anteverted, non tender, no CMT Adnexae: no masses, no tenderness left, no tenderness right   Musculoskeletal: Normal range of motion.  Neurological: She is alert and oriented to person, place, and time.  Skin: Skin is warm and dry.  Psychiatric: She has a normal mood and affect.   Results for orders placed or performed during the hospital encounter of 04/16/16 (from the past 24 hour(s))  Urinalysis, Routine w reflex microscopic     Status: Abnormal   Collection Time: 04/16/16  6:00 PM  Result Value Ref Range   Color, Urine RED (A) YELLOW   APPearance CLOUDY  (A) CLEAR   Specific Gravity, Urine  1.005 - 1.030    TEST NOT REPORTED DUE TO COLOR INTERFERENCE OF URINE PIGMENT   pH  5.0 - 8.0    TEST NOT REPORTED DUE TO COLOR INTERFERENCE OF URINE PIGMENT   Glucose, UA (A) NEGATIVE mg/dL    TEST NOT REPORTED DUE TO COLOR INTERFERENCE OF URINE PIGMENT   Hgb urine dipstick (A) NEGATIVE    TEST NOT REPORTED DUE TO COLOR INTERFERENCE OF URINE PIGMENT   Bilirubin Urine (A) NEGATIVE    TEST NOT REPORTED DUE TO COLOR INTERFERENCE OF URINE PIGMENT   Ketones, ur (A) NEGATIVE mg/dL    TEST NOT REPORTED DUE TO COLOR INTERFERENCE OF URINE PIGMENT   Protein, ur (A) NEGATIVE mg/dL    TEST NOT REPORTED DUE TO COLOR INTERFERENCE OF URINE PIGMENT   Nitrite (A) NEGATIVE    TEST NOT REPORTED DUE TO COLOR INTERFERENCE OF URINE PIGMENT   Leukocytes, UA (A) NEGATIVE    TEST NOT REPORTED DUE TO COLOR INTERFERENCE OF URINE PIGMENT  Urinalysis, Microscopic (reflex)     Status: Abnormal   Collection Time: 04/16/16  6:00 PM  Result Value Ref Range   RBC / HPF TOO NUMEROUS TO COUNT 0 - 5 RBC/hpf   WBC, UA NONE SEEN 0 - 5 WBC/hpf   Bacteria, UA NONE SEEN NONE SEEN   Squamous Epithelial / LPF 0-5 (A) NONE SEEN  Pregnancy, urine POC     Status: None   Collection Time: 04/16/16  6:41 PM  Result Value Ref Range   Preg Test, Ur NEGATIVE NEGATIVE  CBC     Status: Abnormal   Collection Time: 04/16/16  7:35 PM  Result Value Ref Range   WBC 6.6 4.0 - 10.5 K/uL   RBC 4.10 3.87 - 5.11 MIL/uL   Hemoglobin 10.9 (L) 12.0 - 15.0 g/dL   HCT 33.8 (L) 36.0 - 46.0 %   MCV 82.4 78.0 - 100.0 fL   MCH 26.6 26.0 - 34.0 pg   MCHC 32.2 30.0 - 36.0 g/dL   RDW 19.6 (H) 11.5 - 15.5 %   Platelets 271 150 - 400 K/uL   MAU Course  Procedures  MDM Labs ordered and reviewed. Consult with Dr. Glo Herring, discussed presentation, previous treatments, and today's findings. He recommends Megace 40 mg TID until bleeding stops then once daily. Hgb stable. VSS. Stable for discharge home.    Assessment and Plan   1. Abnormal uterine bleeding (AUB)   2. Uterine cramping    Discharge home Follow up in [place] in 2 weeks Bleeding precautions Rx Megace  Allergies as of 04/16/2016   No Known Allergies     Medication List    STOP taking these medications   medroxyPROGESTERone 10 MG tablet Commonly known as:  PROVERA     TAKE these medications   ferrous sulfate 325 (65 FE) MG  tablet Take 1 tablet (325 mg total) by mouth 2 (two) times daily with a meal.   ibuprofen 800 MG tablet Commonly known as:  ADVIL,MOTRIN Take 1 tablet (800 mg total) by mouth every 8 (eight) hours as needed for moderate pain or cramping. What changed:  Another medication with the same name was removed. Continue taking this medication, and follow the directions you see here.   megestrol 40 MG tablet Commonly known as:  MEGACE Take 1 tablet (40 mg total) by mouth 3 (three) times daily. 3 times per day until bleeding stops then once daily      Julianne Handler , CNM 04/16/2016, 7:37 PM

## 2016-04-16 NOTE — Progress Notes (Signed)
Patient is here for establish care  Patient is here for 2 week F/UP  Patient still complains about heavy periods, she started one on 04/10/16 and still on it  Patient complain about cramping  That comes and goes with a pain level of 9-10 mostly hard when clots are coming out  Patient has eaten today  Patient has not taking any meds today

## 2016-04-17 LAB — CBC WITH DIFFERENTIAL/PLATELET
BASOS PCT: 0 %
Basophils Absolute: 0 cells/uL (ref 0–200)
EOS ABS: 70 {cells}/uL (ref 15–500)
Eosinophils Relative: 1 %
HEMATOCRIT: 36.4 % (ref 35.0–45.0)
HEMOGLOBIN: 11.4 g/dL — AB (ref 11.7–15.5)
LYMPHS ABS: 1750 {cells}/uL (ref 850–3900)
LYMPHS PCT: 25 %
MCH: 26.6 pg — ABNORMAL LOW (ref 27.0–33.0)
MCHC: 31.3 g/dL — ABNORMAL LOW (ref 32.0–36.0)
MCV: 84.8 fL (ref 80.0–100.0)
MONO ABS: 420 {cells}/uL (ref 200–950)
MPV: 10.6 fL (ref 7.5–12.5)
Monocytes Relative: 6 %
NEUTROS PCT: 68 %
Neutro Abs: 4760 cells/uL (ref 1500–7800)
Platelets: 317 10*3/uL (ref 140–400)
RBC: 4.29 MIL/uL (ref 3.80–5.10)
RDW: 19.8 % — AB (ref 11.0–15.0)
WBC: 7 10*3/uL (ref 3.8–10.8)

## 2016-04-21 ENCOUNTER — Ambulatory Visit (INDEPENDENT_AMBULATORY_CARE_PROVIDER_SITE_OTHER): Payer: Self-pay | Admitting: Obstetrics and Gynecology

## 2016-04-21 ENCOUNTER — Encounter: Payer: Self-pay | Admitting: Obstetrics and Gynecology

## 2016-04-21 DIAGNOSIS — D259 Leiomyoma of uterus, unspecified: Secondary | ICD-10-CM

## 2016-04-21 DIAGNOSIS — Z789 Other specified health status: Secondary | ICD-10-CM | POA: Insufficient documentation

## 2016-04-21 DIAGNOSIS — Z758 Other problems related to medical facilities and other health care: Secondary | ICD-10-CM | POA: Insufficient documentation

## 2016-04-21 DIAGNOSIS — D649 Anemia, unspecified: Secondary | ICD-10-CM

## 2016-04-21 MED ORDER — PSYLLIUM 28 % PO PACK: 1.0000 | PACK | Freq: Two times a day (BID) | ORAL | 3 refills | Status: DC

## 2016-04-21 MED ORDER — MEGESTROL ACETATE 40 MG PO TABS: 40.0000 mg | ORAL_TABLET | Freq: Three times a day (TID) | ORAL | 1 refills | Status: DC

## 2016-04-21 NOTE — Progress Notes (Signed)
Obstetrics and Gynecology Visit Established Patient Problem Visit  Appointment Date: 04/21/2016  OBGYN Clinic: [facility] [place] (Dr. Rip Harbour)  Primary Care Provider: Fredia Beets  Chief Complaint: MAU follow up  History of Present Illness: Sarah Norman is a 51 y.o. Hispanic [id number] (Patient's last menstrual period was 04/10/2016.), seen for the above chief complaint. Her past medical history is significant for heavy vaginal bleeding (anemia, h/o blood transfusion), fibroid uterus, BMI 42, h/o c-section and BTL   Patient seen in MAU on 2/14 for VB and put on megace 40 tid from provera 10 qday; pt states she called the clinic and was told to come into MAU for evaluation. CBC with slight anemia at Hct 10.9. Patient seen by Dr. Rip Harbour on 1/29 and had a negative pap and embx and continued on provera. She also had a slight complex 3-4cm LO cyst and plan was for to continue provera and rpt u/s with MD f/u in mid march.   Patient states bleeding is none to minimal and no pain or s/s of anemia. She is trying to take the megace tid but sometimes forgets.    Review of Systems: as noted in the History of Present Illness.  Past Medical History:  Past Medical History:  Diagnosis Date  . Anemia   . History of blood transfusion 03/27/2016   "related to anemia"  . Obesity   . Seasonal asthma    "pollen"    Past Surgical History:  Past Surgical History:  Procedure Laterality Date  . CESAREAN SECTION  1993  . TUBAL LIGATION  ~ 1995    Past Obstetrical History:  OB History  Gravida Para Term Preterm AB Living  5 4 4  0 1 4  SAB TAB Ectopic Multiple Live Births  1 0 0 0 0    # Outcome Date GA Lbr Len/2nd Weight Sex Delivery Anes PTL Lv  5 Term           4 Term           3 Term           2 Term           1 SAB               Past Gynecological History: As per prior notes  Social History:  Social History   Social History  . Marital status: Married    Spouse  name: N/A  . Number of children: N/A  . Years of education: N/A   Occupational History  . Not on file.   Social History Main Topics  . Smoking status: Never Smoker  . Smokeless tobacco: Never Used  . Alcohol use No  . Drug use: No  . Sexual activity: Yes    Birth control/ protection: Surgical   Other Topics Concern  . Not on file   Social History Narrative  . No narrative on file    Family History:  Family History  Problem Relation Age of Onset  . Diabetes Mellitus II Father     Medications Ms. Engelkes had no medications administered during this visit. Current Outpatient Prescriptions  Medication Sig Dispense Refill  . ferrous sulfate 325 (65 FE) MG tablet Take 1 tablet (325 mg total) by mouth 2 (two) times daily with a meal. 60 tablet 0  . ibuprofen (ADVIL,MOTRIN) 800 MG tablet Take 1 tablet (800 mg total) by mouth every 8 (eight) hours as needed for moderate pain or cramping. (Patient not taking: Reported on  04/16/2016) 30 tablet 0  . megestrol (MEGACE) 40 MG tablet Take 1 tablet (40 mg total) by mouth 3 (three) times daily. 3 times per day until bleeding stops then once daily 60 tablet 1  . psyllium (METAMUCIL SMOOTH TEXTURE) 28 % packet Take 1 packet by mouth 2 (two) times daily. 30 packet 3   No current facility-administered medications for this visit.     Allergies Patient has no known allergies.   Physical Exam:  BP (!) 147/65   Pulse 79   Wt 236 lb 3.2 oz (107.1 kg)   LMP 04/10/2016   BMI 44.63 kg/m  Body mass index is 44.63 kg/m. General appearance: Well nourished, well developed female in no acute distress.   Laboratory: as above  Radiology:  CLINICAL DATA:  Patient with vaginal bleeding for multiple months. History of irregular periods.  EXAM: TRANSABDOMINAL AND TRANSVAGINAL ULTRASOUND OF PELVIS  TECHNIQUE: Both transabdominal and transvaginal ultrasound examinations of the pelvis were performed. Transabdominal technique was performed  for global imaging of the pelvis including uterus, ovaries, adnexal regions, and pelvic cul-de-sac. It was necessary to proceed with endovaginal exam following the transabdominal exam to visualize the endometrium.  COMPARISON:  None  FINDINGS: Uterus  Measurements: 13.1 x 8.0 x 7.7 cm. There is a 3.5 x 2.7 x 2.2 cm fibroid within the left aspect of the uterine body. Within the lower uterine segment/endocervical canal there is mixed echogenicity complex fluid/soft tissue. Adjacent nabothian cyst.  Endometrium  Thickness: 22.3 mm.  No focal abnormality visualized.  Right ovary  Measurements: 4.3 x 2.0 x 3.0 cm. Normal appearance/no adnexal mass.  Left ovary  Measurements: 6.1 x 6.1 x 6.2 cm. There is a 3.6 x 4.4 x 3.6 cm complex cystic lesion with suggestion of thin internal septation, poorly visualized within the left ovary.  Other findings  No abnormal free fluid.  IMPRESSION: There is mixed echogenicity material expanding the endocervical canal which may represent blood products/hematoma. Endocervical mass is not excluded. Recommend correlation with pelvic examination/direct visualization.  Endometrium measures 22 mm. If bleeding remains unresponsive to hormonal or medical therapy, focal lesion work-up with sonohysterogram should be considered. Endometrial biopsy should also be considered in pre-menopausal patients at high risk for endometrial carcinoma. (Ref: Radiological Reasoning: Algorithmic Workup of Abnormal Vaginal Bleeding with Endovaginal Sonography and Sonohysterography. AJR [id number])  Complex cystic lesion within the left ovary with suggestion of thin internal septations. Recommend follow-up pelvic ultrasound in 6-8 weeks to assess for interval change/ resolution.   Electronically Signed   By: Lovey Newcomer M.D.   On: 03/27/2016 22:03  Assessment: pt doing well  Plan: -Patient told to stay on the megace 40 tid and iron (add  metamucil for constipation) to get iron up from qday to bid.  -I told her to keep mid march u/s and MD visit and will see how she's doing there. If doing well on the megace, could stay on that, but if megace fails and/or she wants to explore surgical options, then can d/w pt then. The megace #45 cost her about $40. I offered to switch her back to provera but a higher dose (that's on $4 list) but she'd like to stay on the megace since it helped with the bleeding this most recent time; I told her that the provera could've helped too but she was on a maintenance dose and not AUB dose. -In terms of surgical options, I reviewed the u/s images and 4cm fibroid could be submucosal and d/w pt  that SM fibroid could definitely be causing her bleeding and can consider hysteroscopy to fully evaluate (or SIS) and resect it. I told her I will forward note to Dr. Rip Harbour and let him decide if he wants to pursue those options.   RTC already scheduled.   Interpreter used.   Durene Romans MD Attending Center for [employer] Fish farm manager)

## 2016-04-22 ENCOUNTER — Other Ambulatory Visit: Payer: Self-pay | Admitting: Family Medicine

## 2016-04-23 ENCOUNTER — Telehealth: Payer: Self-pay

## 2016-04-23 NOTE — Telephone Encounter (Signed)
CMA call to go over lab results  Patient did not answer , CMA left a VM stating the reason of the call & to call us back

## 2016-04-23 NOTE — Telephone Encounter (Signed)
CMA call returning patient call  CMA inform results  Patient was aware and understood

## 2016-04-23 NOTE — Telephone Encounter (Signed)
-----   Message from Alfonse Spruce, [place] sent at 04/22/2016  7:30 PM EST ----- Hemoglobin has improved from previous lab tests. When hemoglobin levels get low then blood transfusion is needed. Continue to take your iron supplements. Start increasing your dietary iron intake. Good sources of iron include dark green leafy vegetables, meats, beans, and iron fortified cereals. Follow up with gynecology.

## 2016-04-23 NOTE — Telephone Encounter (Signed)
Patient returned nurse's call. Patient gave the verbal ok to leave a detailed message on vm if she doesn't answer.  Thank you.

## 2016-05-13 ENCOUNTER — Ambulatory Visit (HOSPITAL_COMMUNITY)
Admission: RE | Admit: 2016-05-13 | Discharge: 2016-05-13 | Disposition: A | Discharge status: Home / Self Care | Payer: Self-pay | Source: Ambulatory Visit | Attending: Obstetrics and Gynecology | Admitting: Obstetrics and Gynecology

## 2016-05-13 DIAGNOSIS — R938 Abnormal findings on diagnostic imaging of other specified body structures: Secondary | ICD-10-CM | POA: Insufficient documentation

## 2016-05-13 DIAGNOSIS — D251 Intramural leiomyoma of uterus: Secondary | ICD-10-CM | POA: Insufficient documentation

## 2016-05-13 DIAGNOSIS — N83202 Unspecified ovarian cyst, left side: Secondary | ICD-10-CM | POA: Insufficient documentation

## 2016-05-16 ENCOUNTER — Ambulatory Visit (INDEPENDENT_AMBULATORY_CARE_PROVIDER_SITE_OTHER): Payer: Self-pay | Admitting: Obstetrics and Gynecology

## 2016-05-16 ENCOUNTER — Encounter: Payer: Self-pay | Admitting: Obstetrics and Gynecology

## 2016-05-16 VITALS — BP 128/69 | HR 76 | Ht 61.0 in | Wt 236.6 lb

## 2016-05-16 DIAGNOSIS — D251 Intramural leiomyoma of uterus: Secondary | ICD-10-CM

## 2016-05-16 DIAGNOSIS — N920 Excessive and frequent menstruation with regular cycle: Secondary | ICD-10-CM

## 2016-05-16 MED ORDER — MEGESTROL ACETATE 40 MG PO TABS: 40.0000 mg | ORAL_TABLET | Freq: Three times a day (TID) | ORAL | 1 refills | Status: DC

## 2016-05-16 NOTE — Progress Notes (Signed)
Pt here for follow up of heavy cycles and U/S for complex left ovarian cyst Cyst has resolved. Intramural fibroid remains unchanged Her bleeding has been controlled with Megace qd. She did have an episode of heavy bleeding earlier and had to increase to tid for a week to control the bleeding. She is tolerating Megace well. She has had a negative EMBX. U/S report reviewed with pt.   PE AF VSS Lungs clear Heart RRR Abd soft + BS  A/P Menorrhagia        Uterine Fibroid  Treatment options reviewed with pt. Including continue with Megace and follow or hysteroscopy with possible resection. Pt elected to continue with Megace and follow her bleeding Will renew Megace and follow up in 6 months.

## 2016-06-27 ENCOUNTER — Telehealth: Payer: Self-pay | Admitting: *Deleted

## 2016-06-27 DIAGNOSIS — N920 Excessive and frequent menstruation with regular cycle: Secondary | ICD-10-CM

## 2016-06-27 NOTE — Telephone Encounter (Signed)
Ashley at [facility] called and left message. Trying to get a refill for megace for this patient, have sent a couple of requests but haven't heard anything. Please call

## 2016-06-30 MED ORDER — MEGESTROL ACETATE 40 MG PO TABS: 40.0000 mg | ORAL_TABLET | Freq: Three times a day (TID) | ORAL | 2 refills | Status: DC

## 2016-06-30 NOTE — Telephone Encounter (Signed)
Megace refilled per Dr Rip Harbour. Sent to [facility] on [address]. Called patient, left voice mail stating I am calling to let her know of a prescription which has been sent to her pharmacy. If she has any questions, please call the clinic.

## 2016-07-18 ENCOUNTER — Ambulatory Visit (INDEPENDENT_AMBULATORY_CARE_PROVIDER_SITE_OTHER): Payer: Self-pay | Admitting: Obstetrics and Gynecology

## 2016-07-18 VITALS — BP 152/85 | HR 82 | Wt 244.6 lb

## 2016-07-18 DIAGNOSIS — D251 Intramural leiomyoma of uterus: Secondary | ICD-10-CM

## 2016-07-18 DIAGNOSIS — N920 Excessive and frequent menstruation with regular cycle: Secondary | ICD-10-CM

## 2016-07-18 NOTE — Progress Notes (Signed)
Sarah Norman presents for continued follow up of her heavy cycles.  She reports her cycles are better with the daily Megace but the first three days of her cycle are very heavy. Changes pads every 30 minutes or hour. Has to change her clothes at times. She also has painful cramps as well. It is starting to interfere with her work and ADL's.  PE AF VSS Lungs clear  Heart RRR Abd soft + bs obese GU enlarged uterus 12 weeks  A/P Uterine fibroids        Heavy cycles secondary to above  Discussed treatment options reviewed. Discussed increasing Megace, Mirena IUD and surgery. Pt desires definite surgery. TVH/BS reviewed with pt. R/B/Post op care reviewed. Pt desires to proceed. She will need to complete financial aid packet and then surgery will be scheduled.

## 2016-07-18 NOTE — Patient Instructions (Signed)
Vaginal Hysterectomy A vaginal hysterectomy is a procedure to remove all or part of the uterus through a small incision in the vagina. In this procedure, your health care provider may remove your entire uterus, including the lower end (cervix). You may need a vaginal hysterectomy to treat:  Uterine fibroids.  A condition that causes the lining of the uterus to grow in other areas (endometriosis).  Problems with pelvic support.  Cancer of the cervix, ovaries, uterus, or tissue that lines the uterus (endometrium).  Excessive (dysfunctional) uterine bleeding. When removing your uterus, your health care provider may also remove the organs that produce eggs (ovaries) and the tubes that carry eggs to your uterus (fallopian tubes). After a vaginal hysterectomy, you will no longer be able to have a baby. You will also no longer get your menstrual period. Tell a health care provider about:  Any allergies you have.  All medicines you are taking, including vitamins, herbs, eye drops, creams, and over-the-counter medicines.  Any problems you or family members have had with anesthetic medicines.  Any blood disorders you have.  Any surgeries you have had.  Any medical conditions you have.  Whether you are pregnant or may be pregnant. What are the risks? Generally, this is a safe procedure. However, problems may occur, including:  Bleeding.  Infection.  A blood clot that forms in your leg and travels to your lungs (pulmonary embolism).  Damage to surrounding organs.  Pain during sex. What happens before the procedure?  Ask your health care provider what organs will be removed during surgery.  Ask your health care provider about:  Changing or stopping your regular medicines. This is especially important if you are taking diabetes medicines or blood thinners.  Taking medicines such as aspirin and ibuprofen. These medicines can thin your blood. Do not take these medicines before your  procedure if your health care provider instructs you not to.  Follow instructions from your health care provider about eating or drinking restrictions.  Do not use any tobacco products, such as cigarettes, chewing tobacco, and e-cigarettes. If you need help quitting, ask your health care provider.  Plan to have someone take you home after discharge from the hospital. What happens during the procedure?  To reduce your risk of infection:  Your health care team will wash or sanitize their hands.  Your skin will be washed with soap.  An IV tube will be inserted into one of your veins.  You may be given antibiotic medicine to help prevent infection.  You will be given one or more of the following:  A medicine to help you relax (sedative).  A medicine to numb the area (local anesthetic).  A medicine to make you fall asleep (general anesthetic).  A medicine that is injected into an area of your body to numb everything beyond the injection site (regional anesthetic).  Your surgeon will make an incision in your vagina.  Your surgeon will locate and remove all or part of your uterus.  Your ovaries and fallopian tubes may be removed at the same time.  The incision will be closed with stitches (sutures) that dissolve over time. The procedure may vary among health care providers and hospitals. What happens after the procedure?  Your blood pressure, heart rate, breathing rate, and blood oxygen level will be monitored often until the medicines you were given have worn off.  You will be encouraged to get up and walk around after a few hours to help prevent complications.  You may have IV tubes in place for a few days.  You will be given pain medicine as needed.  Do not drive for 24 hours if you were given a sedative. This information is not intended to replace advice given to you by your health care provider. Make sure you discuss any questions you have with your health care  provider. Document Released: 06/11/2015 Document Revised: 07/26/2015 Document Reviewed: 03/04/2015 Elsevier Interactive Patient Education  2017 [employer].

## 2016-07-23 ENCOUNTER — Encounter (HOSPITAL_COMMUNITY): Payer: Self-pay

## 2016-08-05 ENCOUNTER — Other Ambulatory Visit: Payer: Self-pay

## 2016-08-05 DIAGNOSIS — N920 Excessive and frequent menstruation with regular cycle: Secondary | ICD-10-CM

## 2016-08-05 MED ORDER — MEGESTROL ACETATE 40 MG PO TABS: 40.0000 mg | ORAL_TABLET | Freq: Every day | ORAL | 1 refills | Status: DC

## 2016-08-05 NOTE — Telephone Encounter (Signed)
Received order to refill pt's Megace 40 mg tablet po daily per Dr. Rip Harbour.  Medication e-prescribed.

## 2016-08-18 ENCOUNTER — Telehealth: Payer: Self-pay | Admitting: *Deleted

## 2016-08-18 NOTE — Telephone Encounter (Signed)
Patient called to front office and left message she is taking 3 pills megestrol  a day and still having heavy bleeding-wants other medicine   I called Delores with [place] interpreter [phone] and left a message we are returning her call.

## 2016-08-27 NOTE — Telephone Encounter (Signed)
I called Sarah Norman again and left a message I am returning her call - if you are having an urgent problem please go to [facility]. If you are having a non urgent problem - please call our office and leave another detailed message.

## 2016-09-10 NOTE — Patient Instructions (Addendum)
Your procedure is scheduled on:  Tuesday, September 23, 2016  Enter through the Main Entrance of [facility] at:  6:00 AM  Pick up the phone at the desk and dial [phone].  Call this number if you have problems the morning of surgery: [phone].  Remember: Do NOT eat food or drink after:  Midnight Monday  Take these medicines the morning of surgery with a SIP OF WATER:  None  Stop ALL herbal medications at this time  Do NOT smoke the day of surgery.  Do NOT wear jewelry (body piercing), metal hair clips/bobby pins, make-up, artifical eyelashes or nail polish. Do NOT wear lotions, powders, or perfumes.  You may wear deodorant. Do NOT shave for 48 hours prior to surgery. Do NOT bring valuables to the hospital. Contacts, dentures, or bridgework may not be worn into surgery.  Leave suitcase in car.  After surgery it may be brought to your room.  For patients admitted to the hospital, checkout time is 11:00 AM the day of discharge.  Bring a copy of your healthcare power of attorney and living will documents.

## 2016-09-11 ENCOUNTER — Encounter (HOSPITAL_COMMUNITY): Payer: Self-pay

## 2016-09-11 ENCOUNTER — Encounter (HOSPITAL_COMMUNITY)
Admission: RE | Admit: 2016-09-11 | Discharge: 2016-09-11 | Disposition: A | Discharge status: Home / Self Care | Payer: Self-pay | Source: Ambulatory Visit | Attending: Obstetrics and Gynecology | Admitting: Obstetrics and Gynecology

## 2016-09-11 DIAGNOSIS — Z01812 Encounter for preprocedural laboratory examination: Secondary | ICD-10-CM | POA: Insufficient documentation

## 2016-09-11 DIAGNOSIS — N920 Excessive and frequent menstruation with regular cycle: Secondary | ICD-10-CM | POA: Insufficient documentation

## 2016-09-11 HISTORY — DX: Other seasonal allergic rhinitis: J30.2

## 2016-09-11 HISTORY — DX: Anesthesia of skin: R20.0

## 2016-09-11 HISTORY — DX: Paresthesia of skin: R20.2

## 2016-09-11 LAB — CBC
HCT: 30.5 % — ABNORMAL LOW (ref 36.0–46.0)
HEMOGLOBIN: 8.9 g/dL — AB (ref 12.0–15.0)
MCH: 20.8 pg — ABNORMAL LOW (ref 26.0–34.0)
MCHC: 29.2 g/dL — AB (ref 30.0–36.0)
MCV: 71.4 fL — ABNORMAL LOW (ref 78.0–100.0)
Platelets: 241 10*3/uL (ref 150–400)
RBC: 4.27 MIL/uL (ref 3.87–5.11)
RDW: 18.1 % — AB (ref 11.5–15.5)
WBC: 6.2 10*3/uL (ref 4.0–10.5)

## 2016-09-11 LAB — BASIC METABOLIC PANEL
ANION GAP: 7 (ref 5–15)
BUN: 11 mg/dL (ref 6–20)
CALCIUM: 8.7 mg/dL — AB (ref 8.9–10.3)
CO2: 22 mmol/L (ref 22–32)
Chloride: 110 mmol/L (ref 101–111)
Creatinine, Ser: 0.74 mg/dL (ref 0.44–1.00)
GFR calc Af Amer: >60 mL/min (ref >60)
GLUCOSE: 98 mg/dL (ref 65–99)
POTASSIUM: 3.5 mmol/L (ref 3.5–5.1)
SODIUM: 139 mmol/L (ref 135–145)

## 2016-09-11 LAB — TYPE AND SCREEN
ABO/RH(D): O POS
ANTIBODY SCREEN: NEGATIVE

## 2016-09-11 LAB — ABO/RH: ABO/RH(D): O POS

## 2016-09-22 DIAGNOSIS — N920 Excessive and frequent menstruation with regular cycle: Secondary | ICD-10-CM

## 2016-09-22 DIAGNOSIS — D259 Leiomyoma of uterus, unspecified: Secondary | ICD-10-CM

## 2016-09-22 NOTE — H&P (Signed)
Sarah Norman is an 51 y.Q.[id number] female with known uterine fibroids, heavy cycles and anemia. Has failed medical treatment for her heavy cycles. She continues to have heavy regular cycles. Has to change her pad q 30 minutes on her heaviest of days. EMBX was negative. She has had a BTL. Additional treatment options were reviewed with her, follow discussion pt desires definite therapy. TVH with BS has been reviewd with pt.   PMH: obesity & anemia  PSH: C section & BTL  POB: TSVD x 3 (largest 10 #)            First trimester SAB            C section x 1 d/t spinal bifida  SH: denies habits  PGYN: Pap smear 1/18 negative    Menstrual History: Menarche age: 38 No LMP recorded. Patient is not currently having periods (Reason: Irregular Periods).    Past Medical History:  Diagnosis Date  . Anemia   . History of blood transfusion 03/27/2016   "related to anemia"  . Numbness and tingling of both legs   . Obesity   . Seasonal allergies   . Seasonal asthma    "pollen"    Past Surgical History:  Procedure Laterality Date  . CESAREAN SECTION  49 3  . TUBAL LIGATION  ~ 51 5    Family History  Problem Relation Age of Onset  . Diabetes Mellitus II Father     Social History:  reports that she has never smoked. She has never used smokeless tobacco. She reports that she does not drink alcohol or use drugs.  Allergies: No Known Allergies  No prescriptions prior to admission.    Review of Systems  Constitutional: Negative.   Respiratory: Negative.   Cardiovascular: Negative.   Gastrointestinal: Negative.   Genitourinary: Negative.     There were no vitals taken for this visit. Physical Exam  Constitutional: She appears well-developed and well-nourished.  Cardiovascular: Normal rate, regular rhythm and normal heart sounds.   Respiratory: Effort normal and breath sounds normal.  GI: Soft. Bowel sounds are normal.  Genitourinary:  Genitourinary Comments: Nl EGBUS,  enlarged uterus @ 12 weeks, mobile, non tender, no adnexal masses    No results found for this or any previous visit (from the past 24 hour(s)).  No results found.  Assessment/Plan: Menorrhagia Uterine Fibroids Anemia secondary to above  As noted above pt continues to have problems with heavy cycles despite medical therapy. She desires definite therapy. TVH/BS has been recommended to pt. R/B/Post op care has been reviewed with pt. Pt has voiced understanding and desires to proceed. Pt is being admitted today for said procedure.   Chancy Milroy 09/22/2016, 12:54 PM

## 2016-09-23 ENCOUNTER — Ambulatory Visit (HOSPITAL_COMMUNITY): Payer: Self-pay | Admitting: Anesthesiology

## 2016-09-23 ENCOUNTER — Encounter (HOSPITAL_COMMUNITY): Payer: Self-pay | Admitting: *Deleted

## 2016-09-23 ENCOUNTER — Observation Stay (HOSPITAL_COMMUNITY)
Admission: RE | Admit: 2016-09-23 | Discharge: 2016-09-24 | Disposition: A | Discharge status: Home / Self Care | Payer: Self-pay | Source: Ambulatory Visit | Attending: Obstetrics and Gynecology | Admitting: Obstetrics and Gynecology

## 2016-09-23 ENCOUNTER — Encounter (HOSPITAL_COMMUNITY): Payer: Self-pay

## 2016-09-23 ENCOUNTER — Encounter (HOSPITAL_COMMUNITY)
Admission: RE | Disposition: A | Discharge status: Home / Self Care | Payer: Self-pay | Source: Ambulatory Visit | Attending: Obstetrics and Gynecology

## 2016-09-23 DIAGNOSIS — E669 Obesity, unspecified: Secondary | ICD-10-CM | POA: Insufficient documentation

## 2016-09-23 DIAGNOSIS — Z6841 Body Mass Index (BMI) 40.0 and over, adult: Secondary | ICD-10-CM | POA: Insufficient documentation

## 2016-09-23 DIAGNOSIS — N920 Excessive and frequent menstruation with regular cycle: Secondary | ICD-10-CM

## 2016-09-23 DIAGNOSIS — D259 Leiomyoma of uterus, unspecified: Secondary | ICD-10-CM | POA: Insufficient documentation

## 2016-09-23 DIAGNOSIS — N888 Other specified noninflammatory disorders of cervix uteri: Principal | ICD-10-CM | POA: Insufficient documentation

## 2016-09-23 DIAGNOSIS — D649 Anemia, unspecified: Secondary | ICD-10-CM | POA: Insufficient documentation

## 2016-09-23 HISTORY — PX: VAGINAL HYSTERECTOMY: SHX2639

## 2016-09-23 LAB — PREGNANCY, URINE: Preg Test, Ur: NEGATIVE

## 2016-09-23 LAB — HEMOGLOBIN AND HEMATOCRIT, BLOOD
HCT: 28.2 % — ABNORMAL LOW (ref 36.0–46.0)
Hemoglobin: 8.2 g/dL — ABNORMAL LOW (ref 12.0–15.0)

## 2016-09-23 SURGERY — HYSTERECTOMY, VAGINAL
Anesthesia: General | Site: Vagina | Laterality: Bilateral

## 2016-09-23 MED ORDER — DEXAMETHASONE SODIUM PHOSPHATE 10 MG/ML IJ SOLN
INTRAMUSCULAR | Status: AC
  Filled 2016-09-23: qty 1

## 2016-09-23 MED ORDER — KETOROLAC TROMETHAMINE 30 MG/ML IJ SOLN
30.0000 mg | Freq: Four times a day (QID) | INTRAMUSCULAR | Status: DC
  Administered 2016-09-23 – 2016-09-24 (×3): 30 mg via INTRAVENOUS
  Filled 2016-09-23 (×2): qty 1

## 2016-09-23 MED ORDER — HYDROMORPHONE HCL 1 MG/ML IJ SOLN
INTRAMUSCULAR | Status: AC
  Filled 2016-09-23: qty 1

## 2016-09-23 MED ORDER — OXYCODONE-ACETAMINOPHEN 5-325 MG PO TABS
1.0000 | ORAL_TABLET | ORAL | Status: DC | PRN
  Administered 2016-09-23 – 2016-09-24 (×2): 1 via ORAL
  Filled 2016-09-23 (×2): qty 1

## 2016-09-23 MED ORDER — IBUPROFEN 800 MG PO TABS: 800.0000 mg | ORAL_TABLET | Freq: Three times a day (TID) | ORAL | Status: DC

## 2016-09-23 MED ORDER — LACTATED RINGERS IV SOLN
INTRAVENOUS | Status: DC
  Administered 2016-09-23: 07:00:00 via INTRAVENOUS

## 2016-09-23 MED ORDER — ZOLPIDEM TARTRATE 5 MG PO TABS: 5.0000 mg | ORAL_TABLET | Freq: Every evening | ORAL | Status: DC | PRN

## 2016-09-23 MED ORDER — SOD CITRATE-CITRIC ACID 500-334 MG/5ML PO SOLN
30.0000 mL | ORAL | Status: AC
  Administered 2016-09-23: 30 mL via ORAL

## 2016-09-23 MED ORDER — CEFAZOLIN SODIUM-DEXTROSE 2-4 GM/100ML-% IV SOLN
2.0000 g | INTRAVENOUS | Status: AC
  Administered 2016-09-23: 2 g via INTRAVENOUS

## 2016-09-23 MED ORDER — PHENYLEPHRINE HCL 10 MG/ML IJ SOLN
INTRAMUSCULAR | Status: DC | PRN
  Administered 2016-09-23: 40 ug via INTRAVENOUS
  Administered 2016-09-23 (×3): 80 ug via INTRAVENOUS

## 2016-09-23 MED ORDER — ONDANSETRON HCL 4 MG PO TABS: 4.0000 mg | ORAL_TABLET | Freq: Four times a day (QID) | ORAL | Status: DC | PRN

## 2016-09-23 MED ORDER — PANTOPRAZOLE SODIUM 40 MG IV SOLR
40.0000 mg | Freq: Every day | INTRAVENOUS | Status: DC
  Administered 2016-09-23: 40 mg via INTRAVENOUS
  Filled 2016-09-23: qty 40

## 2016-09-23 MED ORDER — SUGAMMADEX SODIUM 500 MG/5ML IV SOLN
INTRAVENOUS | Status: DC | PRN
Start: 2016-09-23 — End: 2016-09-23
  Administered 2016-09-23: 221.6 mg via INTRAVENOUS

## 2016-09-23 MED ORDER — HYDROMORPHONE HCL 1 MG/ML IJ SOLN
0.2500 mg | INTRAMUSCULAR | Status: DC | PRN
  Administered 2016-09-23 (×4): 0.5 mg via INTRAVENOUS

## 2016-09-23 MED ORDER — LACTATED RINGERS IV SOLN
INTRAVENOUS | Status: DC
  Administered 2016-09-23 (×2): via INTRAVENOUS

## 2016-09-23 MED ORDER — EPHEDRINE SULFATE 50 MG/ML IJ SOLN
INTRAMUSCULAR | Status: DC | PRN
  Administered 2016-09-23 (×3): 5 mg via INTRAVENOUS

## 2016-09-23 MED ORDER — SIMETHICONE 80 MG PO CHEW: 80.0000 mg | CHEWABLE_TABLET | Freq: Four times a day (QID) | ORAL | Status: DC | PRN

## 2016-09-23 MED ORDER — SENNA 8.6 MG PO TABS
1.0000 | ORAL_TABLET | Freq: Two times a day (BID) | ORAL | Status: DC
  Administered 2016-09-23: 8.6 mg via ORAL
  Filled 2016-09-23 (×3): qty 1

## 2016-09-23 MED ORDER — ROCURONIUM BROMIDE 100 MG/10ML IV SOLN
INTRAVENOUS | Status: AC
  Filled 2016-09-23: qty 1

## 2016-09-23 MED ORDER — KETOROLAC TROMETHAMINE 30 MG/ML IJ SOLN
30.0000 mg | Freq: Four times a day (QID) | INTRAMUSCULAR | Status: DC
  Filled 2016-09-23: qty 1

## 2016-09-23 MED ORDER — PROMETHAZINE HCL 25 MG/ML IJ SOLN: 6.2500 mg | INTRAMUSCULAR | Status: DC | PRN

## 2016-09-23 MED ORDER — FENTANYL CITRATE (PF) 100 MCG/2ML IJ SOLN
INTRAMUSCULAR | Status: AC
  Filled 2016-09-23: qty 2

## 2016-09-23 MED ORDER — GLYCOPYRROLATE 0.2 MG/ML IJ SOLN
INTRAMUSCULAR | Status: AC
  Filled 2016-09-23: qty 1

## 2016-09-23 MED ORDER — LIDOCAINE HCL (CARDIAC) 20 MG/ML IV SOLN
INTRAVENOUS | Status: DC | PRN
  Administered 2016-09-23: 60 mg via INTRAVENOUS

## 2016-09-23 MED ORDER — MENTHOL 3 MG MT LOZG: 1.0000 | LOZENGE | OROMUCOSAL | Status: DC | PRN

## 2016-09-23 MED ORDER — FENTANYL CITRATE (PF) 250 MCG/5ML IJ SOLN
INTRAMUSCULAR | Status: AC
  Filled 2016-09-23: qty 5

## 2016-09-23 MED ORDER — KETOROLAC TROMETHAMINE 30 MG/ML IJ SOLN
INTRAMUSCULAR | Status: AC
  Filled 2016-09-23: qty 1

## 2016-09-23 MED ORDER — ROCURONIUM BROMIDE 100 MG/10ML IV SOLN
INTRAVENOUS | Status: DC | PRN
  Administered 2016-09-23: 50 mg via INTRAVENOUS
  Administered 2016-09-23 (×2): 10 mg via INTRAVENOUS

## 2016-09-23 MED ORDER — DEXAMETHASONE SODIUM PHOSPHATE 10 MG/ML IJ SOLN
INTRAMUSCULAR | Status: DC | PRN
  Administered 2016-09-23: 10 mg via INTRAVENOUS

## 2016-09-23 MED ORDER — LACTATED RINGERS IV SOLN: INTRAVENOUS | Status: DC

## 2016-09-23 MED ORDER — GLYCOPYRROLATE 0.2 MG/ML IJ SOLN
INTRAMUSCULAR | Status: DC | PRN
  Administered 2016-09-23: 0.2 mg via INTRAVENOUS

## 2016-09-23 MED ORDER — LIDOCAINE HCL (CARDIAC) 20 MG/ML IV SOLN
INTRAVENOUS | Status: AC
  Filled 2016-09-23: qty 5

## 2016-09-23 MED ORDER — SOD CITRATE-CITRIC ACID 500-334 MG/5ML PO SOLN
ORAL | Status: AC
  Administered 2016-09-23: 30 mL via ORAL
  Filled 2016-09-23: qty 15

## 2016-09-23 MED ORDER — PROPOFOL 10 MG/ML IV BOLUS
INTRAVENOUS | Status: AC
  Filled 2016-09-23: qty 20

## 2016-09-23 MED ORDER — SCOPOLAMINE 1 MG/3DAYS TD PT72
1.0000 | MEDICATED_PATCH | Freq: Once | TRANSDERMAL | Status: DC
  Administered 2016-09-23: 1.5 mg via TRANSDERMAL

## 2016-09-23 MED ORDER — ONDANSETRON HCL 4 MG/2ML IJ SOLN
INTRAMUSCULAR | Status: AC
  Filled 2016-09-23: qty 2

## 2016-09-23 MED ORDER — MIDAZOLAM HCL 2 MG/2ML IJ SOLN
INTRAMUSCULAR | Status: AC
  Filled 2016-09-23: qty 2

## 2016-09-23 MED ORDER — KETOROLAC TROMETHAMINE 30 MG/ML IJ SOLN
INTRAMUSCULAR | Status: DC | PRN
  Administered 2016-09-23: 30 mg via INTRAVENOUS

## 2016-09-23 MED ORDER — EPHEDRINE 5 MG/ML INJ
INTRAVENOUS | Status: AC
  Filled 2016-09-23: qty 10

## 2016-09-23 MED ORDER — PNEUMOCOCCAL VAC POLYVALENT 25 MCG/0.5ML IJ INJ
0.5000 mL | INJECTION | INTRAMUSCULAR | Status: DC
  Filled 2016-09-23: qty 0.5

## 2016-09-23 MED ORDER — HYDROMORPHONE HCL 1 MG/ML IJ SOLN
0.2000 mg | INTRAMUSCULAR | Status: DC | PRN
  Administered 2016-09-23: 0.4 mg via INTRAVENOUS
  Filled 2016-09-23: qty 1

## 2016-09-23 MED ORDER — ONDANSETRON HCL 4 MG/2ML IJ SOLN
INTRAMUSCULAR | Status: DC | PRN
  Administered 2016-09-23: 4 mg via INTRAVENOUS

## 2016-09-23 MED ORDER — ONDANSETRON HCL 4 MG/2ML IJ SOLN: 4.0000 mg | Freq: Four times a day (QID) | INTRAMUSCULAR | Status: DC | PRN

## 2016-09-23 MED ORDER — MIDAZOLAM HCL 2 MG/2ML IJ SOLN
INTRAMUSCULAR | Status: DC | PRN
  Administered 2016-09-23: 2 mg via INTRAVENOUS

## 2016-09-23 MED ORDER — SCOPOLAMINE 1 MG/3DAYS TD PT72
MEDICATED_PATCH | TRANSDERMAL | Status: AC
  Administered 2016-09-23: 1.5 mg via TRANSDERMAL
  Filled 2016-09-23: qty 1

## 2016-09-23 MED ORDER — FENTANYL CITRATE (PF) 100 MCG/2ML IJ SOLN
INTRAMUSCULAR | Status: DC | PRN
  Administered 2016-09-23: 100 ug via INTRAVENOUS
  Administered 2016-09-23: 50 ug via INTRAVENOUS
  Administered 2016-09-23: 100 ug via INTRAVENOUS

## 2016-09-23 MED ORDER — HYDROMORPHONE HCL 1 MG/ML IJ SOLN
INTRAMUSCULAR | Status: DC | PRN
  Administered 2016-09-23: 1 mg via INTRAVENOUS

## 2016-09-23 MED ORDER — LIDOCAINE HCL 1 % IJ SOLN
INTRAMUSCULAR | Status: AC
  Filled 2016-09-23: qty 10

## 2016-09-23 SURGICAL SUPPLY — 27 items
BLADE SURG 10 STRL SS (BLADE) ×12 IMPLANT
CANISTER SUCT 3000ML PPV (MISCELLANEOUS) ×3 IMPLANT
CLOTH BEACON ORANGE TIMEOUT ST (SAFETY) ×3 IMPLANT
CONT PATH 16OZ SNAP LID 3702 (MISCELLANEOUS) IMPLANT
DECANTER SPIKE VIAL GLASS SM (MISCELLANEOUS) IMPLANT
GAUZE SPONGE 4X4 16PLY XRAY LF (GAUZE/BANDAGES/DRESSINGS) ×3 IMPLANT
GLOVE BIO SURGEON STRL SZ7.5 (GLOVE) ×3 IMPLANT
GLOVE BIO SURGEON STRL SZ8 (GLOVE) ×3 IMPLANT
GLOVE BIOGEL PI IND STRL 6.5 (GLOVE) ×3 IMPLANT
GLOVE BIOGEL PI IND STRL 7.0 (GLOVE) ×2 IMPLANT
GLOVE BIOGEL PI INDICATOR 6.5 (GLOVE) ×6
GLOVE BIOGEL PI INDICATOR 7.0 (GLOVE) ×4
GOWN STRL REUS W/TWL LRG LVL3 (GOWN DISPOSABLE) ×9 IMPLANT
GOWN STRL REUS W/TWL XL LVL3 (GOWN DISPOSABLE) ×3 IMPLANT
NS IRRIG 1000ML POUR BTL (IV SOLUTION) ×3 IMPLANT
PACK TRENDGUARD 600 HYBRD PROC (MISCELLANEOUS) IMPLANT
PACK VAGINAL WOMENS (CUSTOM PROCEDURE TRAY) ×3 IMPLANT
PAD OB MATERNITY 4.3X12.25 (PERSONAL CARE ITEMS) ×3 IMPLANT
SUT VIC AB 2-0 CT1 18 (SUTURE) IMPLANT
SUT VIC AB 2-0 CT1 27 (SUTURE) ×2
SUT VIC AB 2-0 CT1 TAPERPNT 27 (SUTURE) ×1 IMPLANT
SUT VIC AB PLUS 45CM 1-MO-4 (SUTURE) ×18 IMPLANT
SUT VICRYL 0 ENDOLOOP (SUTURE) IMPLANT
SUT VICRYL 1 TIES 12X18 (SUTURE) ×3 IMPLANT
TOWEL OR 17X24 6PK STRL BLUE (TOWEL DISPOSABLE) ×6 IMPLANT
TRAY FOLEY CATH SILVER 14FR (SET/KITS/TRAYS/PACK) ×3 IMPLANT
TRENDGUARD 600 HYBRID PROC PK (MISCELLANEOUS)

## 2016-09-23 NOTE — Transfer of Care (Signed)
Immediate Anesthesia Transfer of Care Note  Patient: Sarah Norman  Procedure(s) Performed: Procedure(s): HYSTERECTOMY VAGINAL W/MORCELLATION (Bilateral)  Patient Location: PACU  Anesthesia Type:General  Level of Consciousness: awake, alert  and oriented  Airway & Oxygen Therapy: Patient Spontanous Breathing and Patient connected to nasal cannula oxygen  Post-op Assessment: Report given to RN and Post -op Vital signs reviewed and stable  Post vital signs: Reviewed and stable  Last Vitals:  Vitals:   09/23/16 0641  BP: (!) 148/72  Pulse: 82  Resp: 18  Temp: 36.9 C    Last Pain:  Vitals:   09/23/16 0641  TempSrc: Oral      Patients Stated Pain Goal: 4 (99/23/41 4436)  Complications: No apparent anesthesia complications

## 2016-09-23 NOTE — Anesthesia Postprocedure Evaluation (Signed)
Anesthesia Post Note  Patient: Sarah Norman  Procedure(s) Performed: Procedure(s) (LRB): HYSTERECTOMY VAGINAL W/MORCELLATION (Bilateral)     Patient location during evaluation: [facility] Anesthesia Type: General Level of consciousness: awake and alert, awake and oriented Pain management: satisfactory to patient Vital Signs Assessment: post-procedure vital signs reviewed and stable Respiratory status: spontaneous breathing, respiratory function stable and nonlabored ventilation Cardiovascular status: stable Postop Assessment: adequate PO intake and no signs of nausea or vomiting Anesthetic complications: no    Last Vitals:  Vitals:   09/23/16 1807 09/23/16 1809  BP: (!) 142/68 121/62  Pulse: 78   Resp: 16   Temp: 37.1 C     Last Pain:  Vitals:   09/23/16 1807  TempSrc: Oral  PainSc:    Pain Goal: Patients Stated Pain Goal: 4 (09/23/16 1655)               India Jolin

## 2016-09-23 NOTE — Anesthesia Procedure Notes (Signed)
Procedure Name: Intubation Date/Time: 09/23/2016 7:22 AM Performed by: [employer], Sheron Nightingale Pre-anesthesia Checklist: Patient identified, Emergency Drugs available, Suction available, Patient being monitored and Timeout performed Patient Re-evaluated:Patient Re-evaluated prior to induction Oxygen Delivery Method: Circle system utilized Preoxygenation: Pre-oxygenation with 100% oxygen Induction Type: IV induction Ventilation: Oral airway inserted - appropriate to patient size Laryngoscope Size: Mac and 3 Grade View: Grade III Number of attempts: 1 Airway Equipment and Method: Patient positioned with wedge pillow Placement Confirmation: ETT inserted through vocal cords under direct vision,  positive ETCO2 and breath sounds checked- equal and bilateral Secured at: 22 cm Dental Injury: Teeth and Oropharynx as per pre-operative assessment

## 2016-09-23 NOTE — Anesthesia Postprocedure Evaluation (Signed)
Anesthesia Post Note  Patient: Sarah Norman  Procedure(s) Performed: Procedure(s) (LRB): HYSTERECTOMY VAGINAL W/MORCELLATION (Bilateral)     Patient location during evaluation: PACU Anesthesia Type: General Level of consciousness: awake and alert Pain management: pain level controlled Vital Signs Assessment: post-procedure vital signs reviewed and stable Respiratory status: spontaneous breathing, nonlabored ventilation, respiratory function stable and patient connected to nasal cannula oxygen Cardiovascular status: blood pressure returned to baseline and stable Postop Assessment: no signs of nausea or vomiting Anesthetic complications: no    Last Vitals:  Vitals:   09/23/16 1100 09/23/16 1115  BP: (!) 145/70 138/69  Pulse: 82 80  Resp: 15 (!) 9  Temp:      Last Pain:  Vitals:   09/23/16 1115  TempSrc:   PainSc: 9                  Calistro Rauf S

## 2016-09-23 NOTE — Op Note (Signed)
[name] PROCEDURE DATE: 09/23/2016  PREOPERATIVE DIAGNOSIS:  Menorrhagia, uterine fibroids and anemia POSTOPERATIVE DIAGNOSIS:  SAA SURGEON:   Chancy Milroy M.D., FACOG ASSISTANT: Aletha Halim, M.D. OPERATION:  Total Vaginal hysterectomy with morcellation ANESTHESIA:  General endotracheal.  INDICATIONS: The patient is a 51 y.o. [id number] with history of symptomatic uterine fibroids/menorrhagia. The patient made a decision to undergo definite surgical treatment. On the preoperative visit, the risks, benefits, indications, and alternatives of the procedure were reviewed with the patient.  On the day of surgery, the risks of surgery were again discussed with the patient including but not limited to: bleeding which may require transfusion or reoperation; infection which may require antibiotics; injury to bowel, bladder, ureters or other surrounding organs; need for additional procedures; thromboembolic phenomenon, incisional problems and other postoperative/anesthesia complications. Written informed consent was obtained.    OPERATIVE FINDINGS: A 15 week size uterus with normal left tube and ovary, unable to see right ovary and tube  ESTIMATED BLOOD LOSS: 400 ml FLUIDS:  1200 ml of Lactated Ringers URINE OUTPUT:  100 ml of clear yellow urine. SPECIMENS:  Uterus and cervix sent to pathology, wt 610 gm. COMPLICATIONS:  None immediate.  DESCRIPTION OF PROCEDURE:  The patient received intravenous antibiotics and had sequential compression devices applied to her lower extremities while in the preoperative area.  She was then taken to the operating room where general anesthesia was administered and was found to be adequate.  She was placed in the dorsal lithotomy position, and was prepped and draped in a sterile manner.  Foley catheter was placed and left indwelling.  After an adequate timeout was performed, attention was turned to her pelvis.  A weighted speculum was then placed in the vagina, and  the anterior and posterior lips of the cervix were grasped with tenaculums. The posterior cul-de-sac was entered sharply without difficulty, lomh bill weighted speculum was placed.  The cervix was then circumferentially incised, and the bladder was dissected off the pubocervical fascia without complication.   Zepplin clamps were then used to clamp the uterosacral ligaments on each side.  They were then cut and sutured ligated with 0 Vicryl.  The cardinal ligaments were then clamped, cut and ligated. The anterior cul-de-sac was then entered sharpely. The uterine vessels and broad ligaments were then serially clamped with Zepplin clamps, cut, and suture ligated on both sides.  Excellent hemostasis was noted at this point.  Due to the size of the uterus, it was morcellated.   The uterus was then delivered via the posterior cul-de-sac, and the cornua were clamped with Zepplin clamps, transected, and the uterus was delivered and sent to pathology. These pedicles were then suture ligated with a 0 Vicryl in a free tie fashion and then a in [place] fashion.  All pedicles were reviewed and interrupted 0 Vicryl sutures were placed for hemostasis.  All pedicles from the uterosacral ligament to the cornua were examined hemostasis was confirmed. The peritoneum was then closed in a purse string fashion with 2/0 Vicryl.  The vaginal cuff was closed with interrupted 3/0 Vicryl. All instruments were then removed from the pelvis. The patient tolerated the procedure well.  All instruments, needles, and sponge counts were correct x 2. The patient was taken to the recovery room in stable condition with IV fluids runing and clear urine in the foley bag.

## 2016-09-23 NOTE — Interval H&P Note (Signed)
History and Physical Interval Note:  09/23/2016 7:15 AM  Sarah Norman  has presented today for surgery, with the diagnosis of DUB  The various methods of treatment have been discussed with the patient and family. After consideration of risks, benefits and other options for treatment, the patient has consented to  Procedure(s): HYSTERECTOMY VAGINAL W/ BILATERAL SALPINGECTOMY (Bilateral) as a surgical intervention .  The patient's history has been reviewed, patient examined, no change in status, stable for surgery.  I have reviewed the patient's chart and labs.  Questions were answered to the patient's satisfaction.     Chancy Milroy

## 2016-09-23 NOTE — Anesthesia Preprocedure Evaluation (Signed)
Anesthesia Evaluation  Patient identified by MRN, date of birth, ID band Patient awake    Reviewed: Allergy & Precautions, NPO status , Patient's Chart, lab work & pertinent test results  Airway Mallampati: II  TM Distance: >3 FB Neck ROM: Full    Dental no notable dental hx.    Pulmonary neg pulmonary ROS,    Pulmonary exam normal breath sounds clear to auscultation       Cardiovascular negative cardio ROS Normal cardiovascular exam Rhythm:Regular Rate:Normal     Neuro/Psych negative neurological ROS  negative psych ROS   GI/Hepatic negative GI ROS, Neg liver ROS,   Endo/Other  negative endocrine ROS  Renal/GU negative Renal ROS  negative genitourinary   Musculoskeletal negative musculoskeletal ROS (+)   Abdominal   Peds negative pediatric ROS (+)  Hematology  (+) anemia ,   Anesthesia Other Findings   Reproductive/Obstetrics negative OB ROS                             Anesthesia Physical Anesthesia Plan  ASA: II  Anesthesia Plan: General   Post-op Pain Management:    Induction: Intravenous  PONV Risk Score and Plan: 2 and Ondansetron, Dexamethasone and Treatment may vary due to age or medical condition  Airway Management Planned: Oral ETT  Additional Equipment:   Intra-op Plan:   Post-operative Plan: Extubation in OR  Informed Consent: I have reviewed the patients History and Physical, chart, labs and discussed the procedure including the risks, benefits and alternatives for the proposed anesthesia with the patient or authorized representative who has indicated his/her understanding and acceptance.   Dental advisory given  Plan Discussed with: CRNA and Surgeon  Anesthesia Plan Comments:         Anesthesia Quick Evaluation  

## 2016-09-23 NOTE — Progress Notes (Signed)
Day of Surgery  HYSTERECTOMY VAGINAL W/MORCELLATION   Subjective: Pt without complaints. Pain controlled. Tolerating diet. Has been up OOB without problems.    Objective: AF VSS  Good UOP  Lungs clear Heart RRR Abd soft faint BS GU no bleeding, foley Ext SCD's  Hgb 8.9 > 8.2  Assessment: Stable  Plan: Will d/c foley. IF fluids to INT Continue with supportive care   LOS: 0 days    Chancy Milroy 09/23/2016, 9:16 PM

## 2016-09-23 NOTE — Anesthesia Procedure Notes (Signed)
Performed by: Daxton Nydam M       

## 2016-09-23 NOTE — Addendum Note (Signed)
Addendum  created 09/23/16 1844 by Flossie Dibble, CRNA   Sign clinical note

## 2016-09-24 ENCOUNTER — Encounter (HOSPITAL_COMMUNITY): Payer: Self-pay | Admitting: Obstetrics and Gynecology

## 2016-09-24 LAB — BASIC METABOLIC PANEL
ANION GAP: 6 (ref 5–15)
BUN: 14 mg/dL (ref 6–20)
CHLORIDE: 109 mmol/L (ref 101–111)
CO2: 21 mmol/L — ABNORMAL LOW (ref 22–32)
Calcium: 8.2 mg/dL — ABNORMAL LOW (ref 8.9–10.3)
Creatinine, Ser: 0.87 mg/dL (ref 0.44–1.00)
GFR calc Af Amer: >60 mL/min (ref >60)
GFR calc non Af Amer: >60 mL/min (ref >60)
GLUCOSE: 138 mg/dL — AB (ref 65–99)
Potassium: 3.7 mmol/L (ref 3.5–5.1)
SODIUM: 136 mmol/L (ref 135–145)

## 2016-09-24 LAB — CBC
HCT: 25.2 % — ABNORMAL LOW (ref 36.0–46.0)
HEMOGLOBIN: 7.4 g/dL — AB (ref 12.0–15.0)
MCH: 20.5 pg — AB (ref 26.0–34.0)
MCHC: 29.4 g/dL — ABNORMAL LOW (ref 30.0–36.0)
MCV: 69.8 fL — AB (ref 78.0–100.0)
Platelets: 163 10*3/uL (ref 150–400)
RBC: 3.61 MIL/uL — AB (ref 3.87–5.11)
RDW: 18.3 % — ABNORMAL HIGH (ref 11.5–15.5)
WBC: 9.2 10*3/uL (ref 4.0–10.5)

## 2016-09-24 MED ORDER — OXYCODONE-ACETAMINOPHEN 5-325 MG PO TABS: 1.0000 | ORAL_TABLET | ORAL | 0 refills | Status: DC | PRN

## 2016-09-24 MED ORDER — IBUPROFEN 800 MG PO TABS: 800.0000 mg | ORAL_TABLET | Freq: Three times a day (TID) | ORAL | 0 refills | Status: AC

## 2016-09-24 NOTE — Discharge Summary (Signed)
Physician Discharge Summary  Patient ID: Sarah Norman MRN: [id number] DOB/AGE: [birth year] 49 y.o.  Admit date: 09/23/2016 Discharge date: 09/24/2016  Admission Diagnoses: Menorrhagia, uterine fibroids and anemia  Discharge Diagnoses:  SAA  Discharged Condition: stable  Hospital Course: Pt was admitted with above Dx and underwent TVH with morcellation without problems. See OP note for additional information. Pt's post op course was unremarkable. Progressed to ambulating, voiding, + flatus, tolerating diet, good oral pain control. Felt amendable for discharge home on POD # 1.  Consults: None  Significant Diagnostic Studies: labs  Treatments: surgery as noted above.  Discharge Exam: Blood pressure 121/61, pulse 82, temperature 98.7 F (37.1 C), temperature source Oral, resp. rate 18, height 5\' 4"  (1.626 m), weight 110.8 kg (244 lb 6 oz), SpO2 100 %. Lungs clear Heart RRR Abd soft + BS GU no bleeding Ext non tender  Disposition: 01-Home or Self Care  Discharge Instructions    Call MD for:  difficulty breathing, headache or visual disturbances    Complete by:  As directed    Call MD for:  extreme fatigue    Complete by:  As directed    Call MD for:  hives    Complete by:  As directed    Call MD for:  persistant dizziness or light-headedness    Complete by:  As directed    Call MD for:  persistant nausea and vomiting    Complete by:  As directed    Call MD for:  redness, tenderness, or signs of infection (pain, swelling, redness, odor or green/yellow discharge around incision site)    Complete by:  As directed    Call MD for:  severe uncontrolled pain    Complete by:  As directed    Call MD for:  temperature >100.4    Complete by:  As directed    Diet - low sodium heart healthy    Complete by:  As directed    Driving Restrictions    Complete by:  As directed    No driving for 2 weeks   Increase activity slowly    Complete by:  As directed    Sexual Activity  Restrictions    Complete by:  As directed    Pelvic rest until post op visit     Allergies as of 09/24/2016   No Known Allergies     Medication List    STOP taking these medications   megestrol 40 MG tablet Commonly known as:  MEGACE     TAKE these medications   ferrous sulfate 325 (65 FE) MG tablet Take 1 tablet (325 mg total) by mouth 2 (two) times daily with a meal.   ibuprofen 200 MG tablet Commonly known as:  ADVIL,MOTRIN Take 200 mg by mouth daily as needed (for pain.). What changed:  Another medication with the same name was added. Make sure you understand how and when to take each.   ibuprofen 800 MG tablet Commonly known as:  ADVIL,MOTRIN Take 1 tablet (800 mg total) by mouth every 8 (eight) hours as needed for moderate pain or cramping. What changed:  Another medication with the same name was added. Make sure you understand how and when to take each.   ibuprofen 800 MG tablet Commonly known as:  ADVIL,MOTRIN Take 1 tablet (800 mg total) by mouth every 8 (eight) hours. What changed:  You were already taking a medication with the same name, and this prescription was added. Make sure you understand how and  when to take each.   oxyCODONE-acetaminophen 5-325 MG tablet Commonly known as:  PERCOCET/ROXICET Take 1-2 tablets by mouth every 4 (four) hours as needed (moderate to severe pain (when tolerating fluids)).   psyllium 28 % packet Commonly known as:  METAMUCIL SMOOTH TEXTURE Take 1 packet by mouth 2 (two) times daily.      Follow-up Information    [facility]. Schedule an appointment as soon as possible for a visit in 4 week(s).   Why:  Post OP appt with Dr. Gardiner Fanti Contact information: [place] [phone]          Signed: Chancy Milroy 09/24/2016, 7:38 AM

## 2016-09-24 NOTE — Progress Notes (Signed)
Patient discharged home with husband. Discharged teaching, paperwork, home care, prescriptions, and follow-up appts reviewed. Pneumococcal vaccine offered and VIS provided in [place] and [place]. Pt decided to discuss vaccine with MD at her follow-up appt before receiving vaccine. No questions at this time.

## 2016-09-24 NOTE — Discharge Instructions (Signed)
Vaginal Hysterectomy, Care After °Refer to this sheet in the next few weeks. These instructions provide you with information about caring for yourself after your procedure. Your health care provider may also give you more specific instructions. Your treatment has been planned according to current medical practices, but problems sometimes occur. Call your health care provider if you have any problems or questions after your procedure. °What can I expect after the procedure? °After the procedure, it is common to have: °· Pain. °· Soreness and numbness in your incision areas. °· Vaginal bleeding and discharge. °· Constipation. °· Temporary problems emptying the bladder. °· Feelings of sadness or other emotions. ° °Follow these instructions at home: °Medicines °· Take over-the-counter and prescription medicines only as told by your health care provider. °· If you were prescribed an antibiotic medicine, take it as told by your health care provider. Do not stop taking the antibiotic even if you start to feel better. °· Do not drive or operate heavy machinery while taking prescription pain medicine. °Activity °· Return to your normal activities as told by your health care provider. Ask your health care provider what activities are safe for you. °· Get regular exercise as told by your health care provider. You may be told to take short walks every day and go farther each time. °· Do not lift anything that is heavier than 10 lb (4.5 kg). °General instructions ° °· Do not put anything in your vagina for 6 weeks after your surgery or as told by your health care provider. This includes tampons and douches. °· Do not have sex until your health care provider says you can. °· Do not take baths, swim, or use a hot tub until your health care provider approves. °· Drink enough fluid to keep your urine clear or pale yellow. °· Do not drive for 24 hours if you were given a sedative. °· Keep all follow-up visits as told by your health  care provider. This is important. °Contact a health care provider if: °· Your pain medicine is not helping. °· You have a fever. °· You have redness, swelling, or pain at your incision site. °· You have blood, pus, or a bad-smelling discharge from your vagina. °· You continue to have difficulty urinating. °Get help right away if: °· You have severe abdominal or back pain. °· You have heavy bleeding from your vagina. °· You have chest pain or shortness of breath. °This information is not intended to replace advice given to you by your health care provider. Make sure you discuss any questions you have with your health care provider. °Document Released: 06/11/2015 Document Revised: 07/26/2015 Document Reviewed: 03/04/2015 °Elsevier Interactive Patient Education © 2018 [employer] ° °

## 2016-10-03 ENCOUNTER — Other Ambulatory Visit: Payer: Self-pay | Admitting: Obstetrics and Gynecology

## 2016-10-03 ENCOUNTER — Other Ambulatory Visit: Payer: Self-pay | Admitting: Obstetrics

## 2016-10-03 DIAGNOSIS — N920 Excessive and frequent menstruation with regular cycle: Secondary | ICD-10-CM

## 2016-10-24 ENCOUNTER — Encounter: Payer: Self-pay | Admitting: Obstetrics and Gynecology

## 2016-10-24 ENCOUNTER — Ambulatory Visit (INDEPENDENT_AMBULATORY_CARE_PROVIDER_SITE_OTHER): Payer: Self-pay | Admitting: Obstetrics and Gynecology

## 2016-10-24 DIAGNOSIS — Z9889 Other specified postprocedural states: Secondary | ICD-10-CM | POA: Insufficient documentation

## 2016-10-24 NOTE — Progress Notes (Signed)
Patient here for post op visit, vaginal hysterectomy 7/24. C/o leg pain since surgery, was bilateral, now only left leg.

## 2016-10-24 NOTE — Patient Instructions (Signed)

## 2016-10-24 NOTE — Progress Notes (Signed)
Patient ID: Sarah Norman, female   DOB: [birth year], 51 y.o.   MRN: [id number]  [name] is here for post op visit. Had TVH on 09/23/16. Post op course was unremarkable. Reports doing well except for some slight numbness ant/side left upper leg. Feels is improving  No weakness. Ambulating without problems. No bowel or bladder dysfunction. Pathology reviewed with pt.  PE AF VSS Lungs clear Heart RRR And soft + BS  GU white discharge cuff healing well no tenderness or masses Ext decreased sensation region of cutaneous femoral nerve  A/P Post OP TVH         Pt to return to nl ADL's. Reframe from [place] for 2 more weeks Will follow nerve Sx for now as seems to be improving. Pt instructed to call if Sx have not resolved by the end of September o/w follow up with yearly exam

## 2019-02-02 IMAGING — US US TRANSVAGINAL NON-OB
1 series · 15 of 25 positions shown · non-contrast
Comparison: 03/27/2016

CLINICAL DATA: Left ovarian cysts

EXAM:
TRANSABDOMINAL AND TRANSVAGINAL ULTRASOUND OF PELVIS
TECHNIQUE: Both transabdominal and transvaginal ultrasound examinations of the
pelvis were performed. Transabdominal technique was performed for
global imaging of the pelvis including uterus, ovaries, adnexal
regions, and pelvic cul-de-sac. It was necessary to proceed with
endovaginal exam following the transabdominal exam to visualize the
uterus, endometrium, ovaries and adnexa .

[Series 1: us transvaginal non-ob · 15 of 68 slices shown]
[im 1/68]
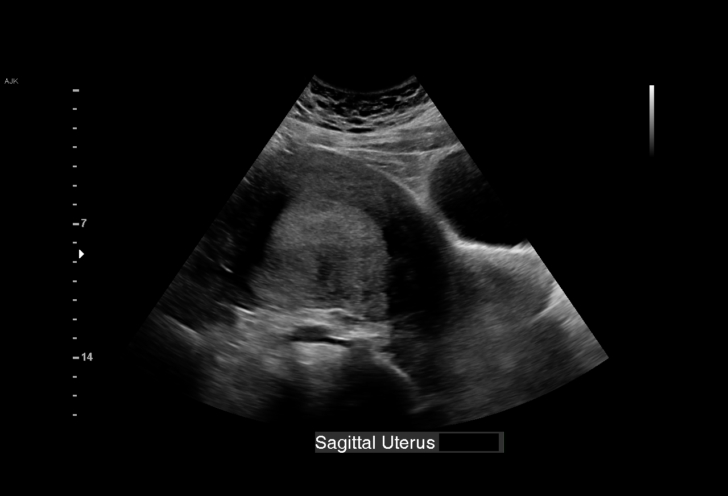
[im 6/68]
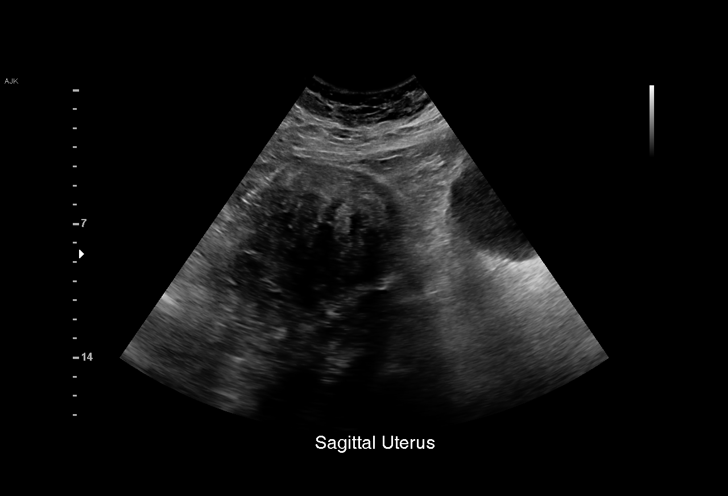
[im 12/68]
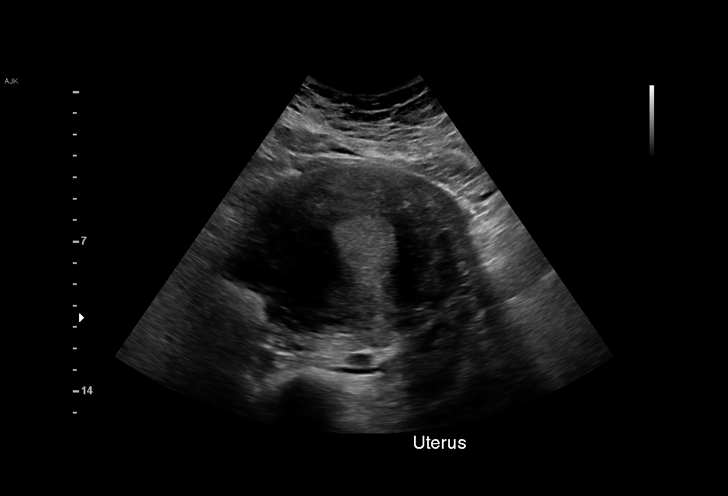
[im 14/68]
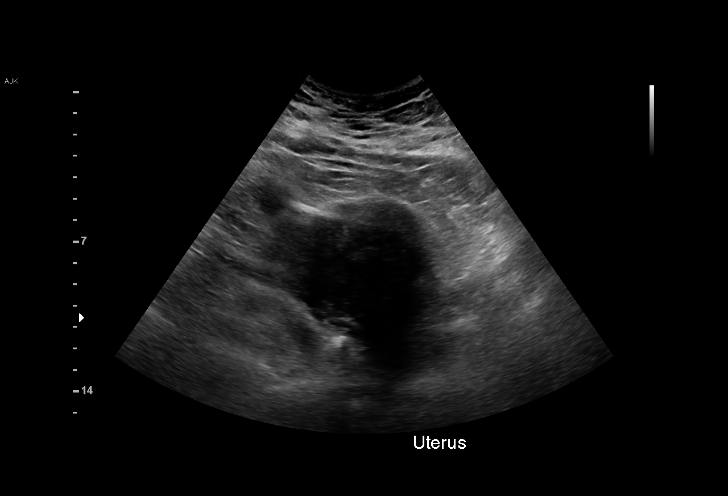
[im 20/68]
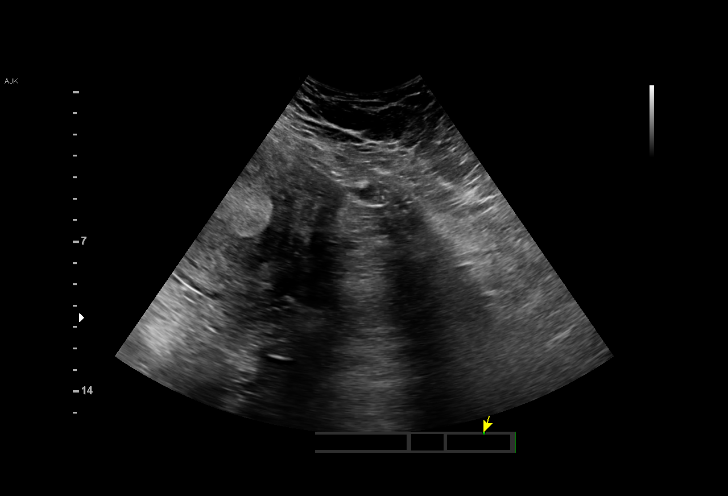
[im 26/68]
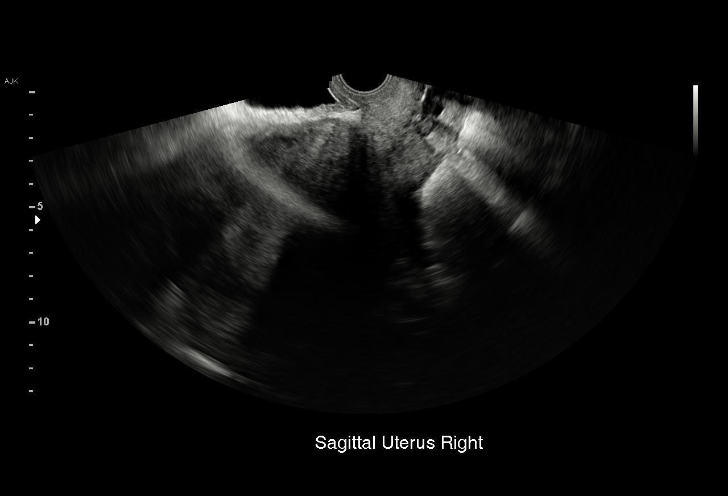
[im 28/68]
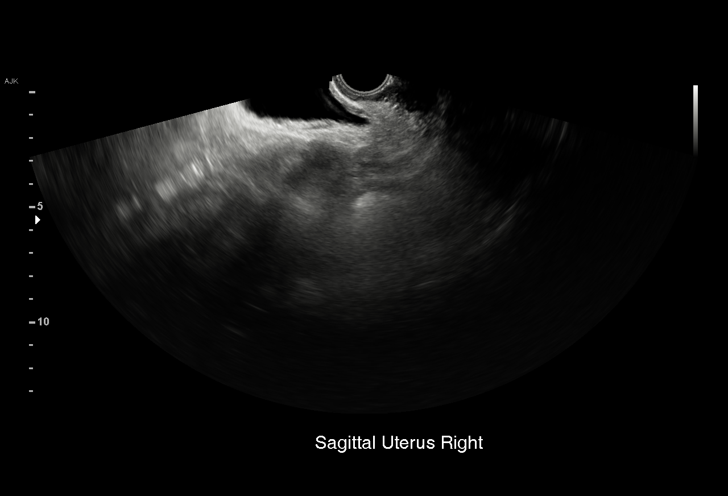
[im 34/68]
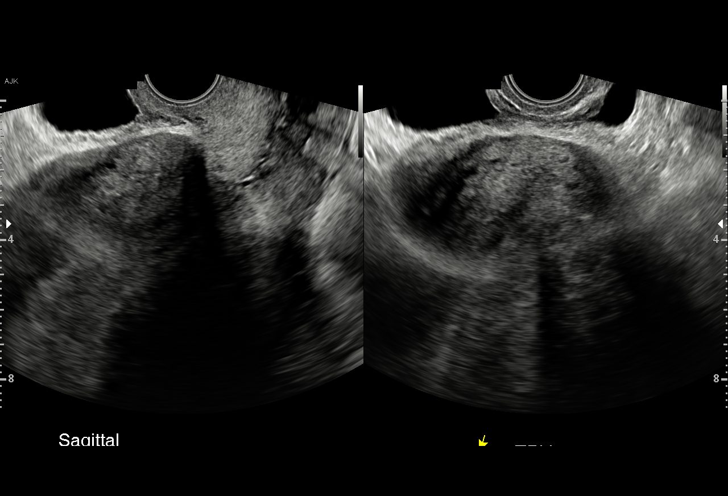
[im 40/68]
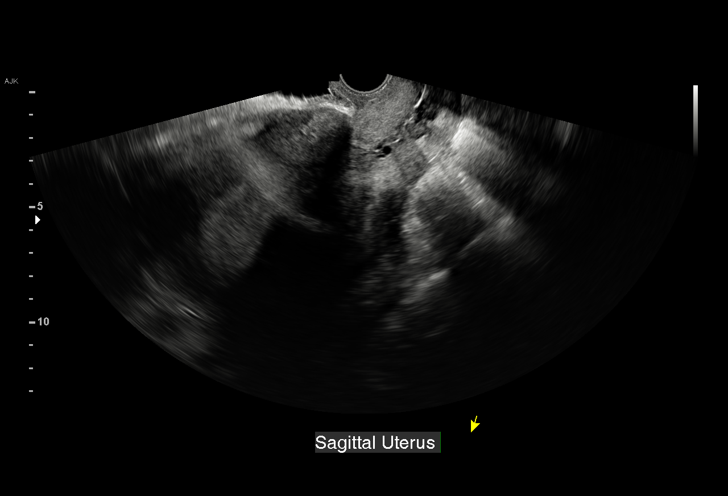
[im 42/68]
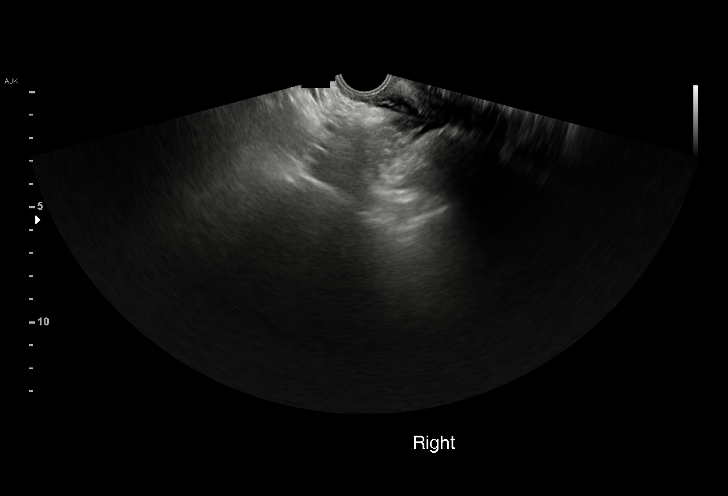
[im 48/68]
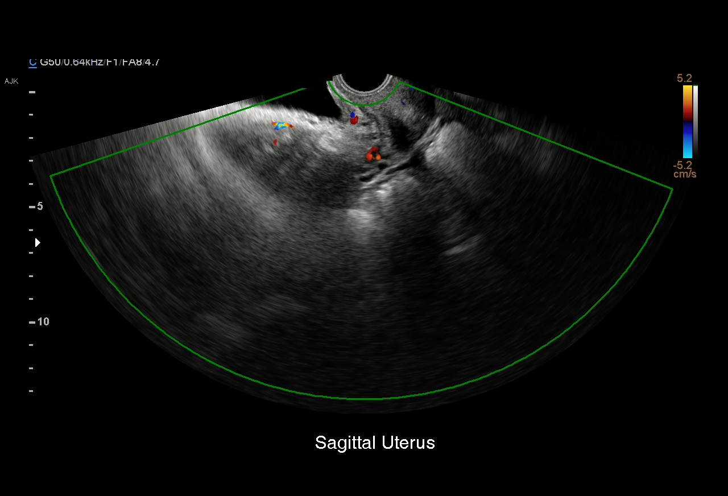
[im 54/68]
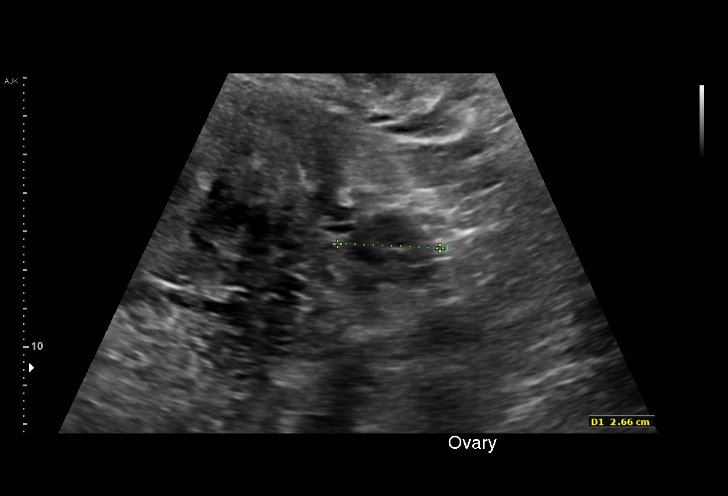
[im 56/68]
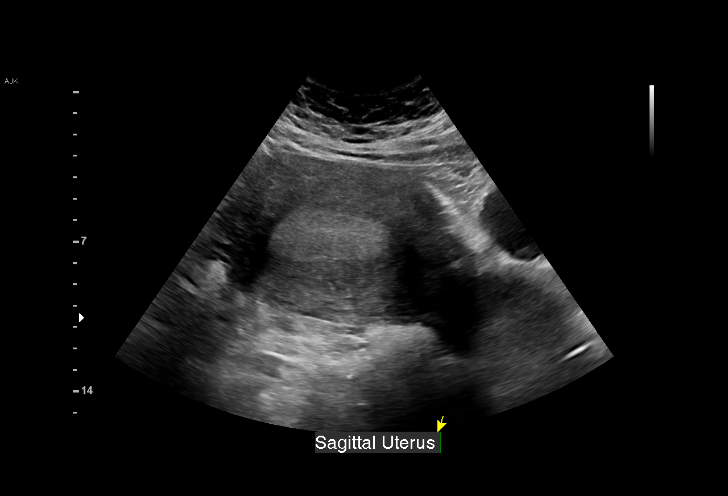
[im 62/68]
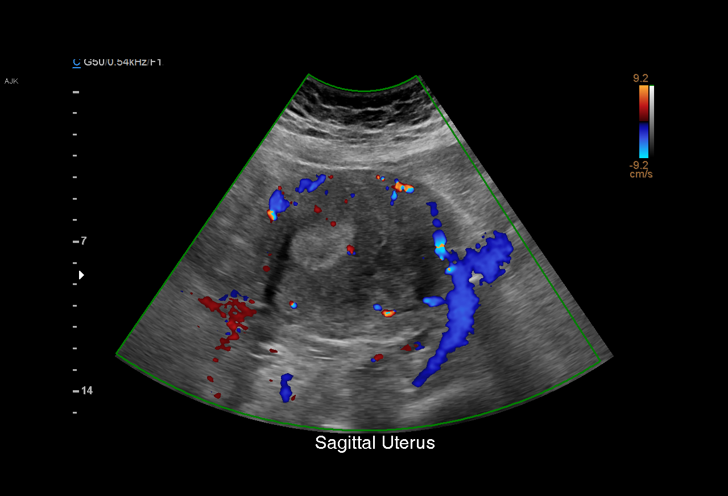
[im 68/68]
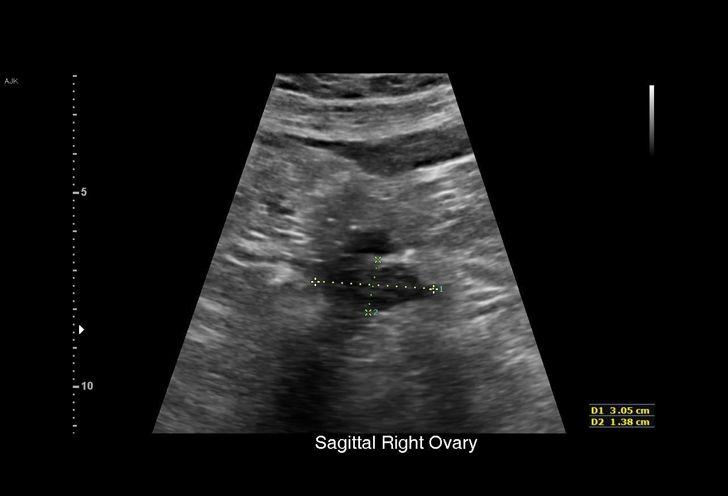

[15 of 25 positions shown; findings below may reference images not displayed]

FINDINGS: Uterus

Measurements: 17.4 x 8.5 x 12.4 cm. Anterior lower uterine body
fibroid measures 4.2 x 3.8 x 2.5 cm

Endometrium

Thickness: 34 mm in thickness, increasing since prior study.
Endometrium appears vascular

Right ovary

Measurements: 3.1 x 1.4 x 3.3 cm. Normal appearance/no adnexal mass.

Left ovary

Measurements: 2.3 x 2.7 x 2.7 cm. Normal appearance/no adnexal mass.

Other findings

No abnormal free fluid.
IMPRESSION: Enlarged uterus with increasing abnormally thickened endometrium.
Consider follow-up by US in 6-8 weeks, during the week immediately
following menses (exam timing is critical).

4.2 cm anterior intramural fibroid.

Previously seen complex left ovarian cyst has resolved.

## 2021-12-28 ENCOUNTER — Ambulatory Visit (HOSPITAL_COMMUNITY)
Admission: EM | Admit: 2021-12-28 | Discharge: 2021-12-28 | Disposition: A | Discharge status: Home / Self Care | Payer: Self-pay | Attending: Physician Assistant | Admitting: Physician Assistant

## 2021-12-28 ENCOUNTER — Encounter (HOSPITAL_COMMUNITY): Payer: Self-pay

## 2021-12-28 ENCOUNTER — Ambulatory Visit (INDEPENDENT_AMBULATORY_CARE_PROVIDER_SITE_OTHER): Payer: Self-pay

## 2021-12-28 DIAGNOSIS — R202 Paresthesia of skin: Secondary | ICD-10-CM | POA: Insufficient documentation

## 2021-12-28 DIAGNOSIS — M545 Low back pain, unspecified: Secondary | ICD-10-CM

## 2021-12-28 DIAGNOSIS — Z9109 Other allergy status, other than to drugs and biological substances: Secondary | ICD-10-CM | POA: Insufficient documentation

## 2021-12-28 LAB — CBC WITH DIFFERENTIAL/PLATELET
Abs Immature Granulocytes: 0.01 10*3/uL (ref 0.00–0.07)
Basophils Absolute: 0 10*3/uL (ref 0.0–0.1)
Basophils Relative: 0 %
Eosinophils Absolute: 0.1 10*3/uL (ref 0.0–0.5)
Eosinophils Relative: 2 %
HCT: 42.7 % (ref 36.0–46.0)
Hemoglobin: 14.6 g/dL (ref 12.0–15.0)
Immature Granulocytes: 0 %
Lymphocytes Relative: 36 %
Lymphs Abs: 1.3 10*3/uL (ref 0.7–4.0)
MCH: 30.4 pg (ref 26.0–34.0)
MCHC: 34.2 g/dL (ref 30.0–36.0)
MCV: 88.8 fL (ref 80.0–100.0)
Monocytes Absolute: 0.3 10*3/uL (ref 0.1–1.0)
Monocytes Relative: 9 %
Neutro Abs: 1.9 10*3/uL (ref 1.7–7.7)
Neutrophils Relative %: 53 %
Platelets: 134 10*3/uL — ABNORMAL LOW (ref 150–400)
RBC: 4.81 MIL/uL (ref 3.87–5.11)
RDW: 13.2 % (ref 11.5–15.5)
WBC: 3.5 10*3/uL — ABNORMAL LOW (ref 4.0–10.5)
nRBC: 0 % (ref 0.0–0.2)

## 2021-12-28 LAB — COMPREHENSIVE METABOLIC PANEL
ALT: 59 U/L — ABNORMAL HIGH (ref 0–44)
AST: 35 U/L (ref 15–41)
Albumin: 3.9 g/dL (ref 3.5–5.0)
Alkaline Phosphatase: 64 U/L (ref 38–126)
Anion gap: 8 (ref 5–15)
BUN: 12 mg/dL (ref 6–20)
CO2: 26 mmol/L (ref 22–32)
Calcium: 9 mg/dL (ref 8.9–10.3)
Chloride: 109 mmol/L (ref 98–111)
Creatinine, Ser: 0.68 mg/dL (ref 0.44–1.00)
GFR, Estimated: >60 mL/min (ref >60)
Glucose, Bld: 99 mg/dL (ref 70–99)
Potassium: 3.8 mmol/L (ref 3.5–5.1)
Sodium: 143 mmol/L (ref 135–145)
Total Bilirubin: 0.8 mg/dL (ref 0.3–1.2)
Total Protein: 7.2 g/dL (ref 6.5–8.1)

## 2021-12-28 LAB — HEMOGLOBIN A1C
Hgb A1c MFr Bld: 5.5 % (ref 4.8–5.6)
Mean Plasma Glucose: 111.15 mg/dL

## 2021-12-28 LAB — VITAMIN B12: Vitamin B-12: 452 pg/mL (ref 180–914)

## 2021-12-28 LAB — TSH: TSH: 5.421 u[IU]/mL — ABNORMAL HIGH (ref 0.350–4.500)

## 2021-12-28 MED ORDER — FLUTICASONE PROPIONATE 50 MCG/ACT NA SUSP: 2.0000 | Freq: Every day | NASAL | 0 refills | Status: AC

## 2021-12-28 MED ORDER — LEVOCETIRIZINE DIHYDROCHLORIDE 5 MG PO TABS: 5.0000 mg | ORAL_TABLET | Freq: Every evening | ORAL | 0 refills | Status: AC

## 2021-12-28 MED ORDER — GABAPENTIN 300 MG PO CAPS: 300.0000 mg | ORAL_CAPSULE | Freq: Every day | ORAL | 0 refills | Status: AC

## 2021-12-28 NOTE — Discharge Instructions (Signed)
Very good to meet you.  Thanks for coming in today.  Your x-ray of your back look normal.  This is reassuring and good news.  I have drawn a panel of blood work and we will call you with results.  I do suggest that you follow-up with a primary care provider, such as Dr. Randol Kern.  Please see attached.  You may try gabapentin 1 capsule at bedtime for leg burning and tingling pain.  Please be aware of possible side effects and call if any concerns.  You may also try Flonase and Xyzal for your allergies.  Follow-up immediately at the emergency department if any sudden worsening or change in symptoms.

## 2021-12-28 NOTE — ED Triage Notes (Addendum)
Burning pain in both legs but mainly the left thigh. On and off for a few years (3 yrs). Patient states she had a surgery and when waking up from anaesthesia the burning and numbness started.   Yesterday when getting of car the burning started. Pain was worse today. Pain worse with movement.   Patient states she has seasonal allergies. 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

## 2021-12-28 NOTE — ED Provider Notes (Signed)
[facility]   MRN: [id number] DOB: 10-04-1965  Subjective:   Sarah Norman is a 56 y.o. female presenting for complaints of bilateral upper leg numbness and tingling on the outside of her legs.  States the burning pain is intermittent.  Has been going on for the last several years.  She is here tonight because she would like to know what is causing her symptoms.  She is not complaining of much pain at this time.  No recent injury.  She is also having a flareup of some allergies.  She has some cough and nasal congestion and sneezing.  Negative for any fevers, saddle anesthesia, foot-drop, incontinence of urine or stool, hx of cancer, and no recent injury.   No current facility-administered medications for this encounter.  Current Outpatient Medications:    fluticasone (FLONASE) 50 MCG/ACT nasal spray, Place 2 sprays into both nostrils daily., Disp: 16 g, Rfl: 0   gabapentin (NEURONTIN) 300 MG capsule, Take 1 capsule (300 mg total) by mouth at bedtime. Take for burning and tingling pain in legs., Disp: 15 capsule, Rfl: 0   levocetirizine (XYZAL) 5 MG tablet, Take 1 tablet (5 mg total) by mouth every evening., Disp: 30 tablet, Rfl: 0   ibuprofen (ADVIL,MOTRIN) 200 MG tablet, Take 200 mg by mouth daily as needed (for pain.)., Disp: , Rfl:    ibuprofen (ADVIL,MOTRIN) 800 MG tablet, Take 1 tablet (800 mg total) by mouth every 8 (eight) hours., Disp: 30 tablet, Rfl: 0   No Known Allergies  Past Medical History:  Diagnosis Date   Anemia    History of blood transfusion 03/27/2016   "related to anemia"   Numbness and tingling of both legs    Obesity    Seasonal allergies    Seasonal asthma    "pollen"     Past Surgical History:  Procedure Laterality Date   CESAREAN SECTION  1993   TUBAL LIGATION  ~ [place] Bilateral 09/23/2016   Procedure: HYSTERECTOMY VAGINAL W/MORCELLATION;  Surgeon: Chancy Milroy, MD;  Location: [facility];  Service:  Gynecology;  Laterality: Bilateral;    Family History  Problem Relation Age of Onset   Diabetes Mellitus II Father     Social History   Tobacco Use   Smoking status: Never   Smokeless tobacco: Never  Substance Use Topics   Alcohol use: No   Drug use: No    ROS REFER TO HPI FOR PERTINENT POSITIVES AND NEGATIVES   Objective:   Vitals: BP (!) 148/90 (BP Location: Right Arm)   Pulse 77   Temp 98.1 F (36.7 C) (Oral)   Resp 16   LMP 04/16/2016   SpO2 98%   Physical Exam Vitals and nursing note reviewed.  Constitutional:      General: She is not in acute distress.    Appearance: Normal appearance. She is not ill-appearing.  HENT:     Head: Normocephalic.     Right Ear: Tympanic membrane, ear canal and external ear normal.     Left Ear: Tympanic membrane, ear canal and external ear normal.     Nose: Congestion present.     Mouth/Throat:     Mouth: Mucous membranes are moist.     Pharynx: No oropharyngeal exudate or posterior oropharyngeal erythema.  Eyes:     Extraocular Movements: Extraocular movements intact.     Conjunctiva/sclera: Conjunctivae normal.     Pupils: Pupils are equal, round, and reactive to light.  Cardiovascular:     Rate and Rhythm: Normal rate and regular rhythm.     Pulses: Normal pulses.     Heart sounds: Normal heart sounds. No murmur heard. Pulmonary:     Effort: Pulmonary effort is normal. No respiratory distress.     Breath sounds: Normal breath sounds. No wheezing.  Musculoskeletal:        General: No swelling, tenderness, deformity or signs of injury.     Cervical back: Normal range of motion.     Lumbar back: No spasms or tenderness. Normal range of motion. Negative right straight leg raise test and negative left straight leg raise test.     Right lower leg: No edema.     Left lower leg: No edema.     Comments: Complains of lateral upper thigh paresthesias, none currently, neurovascular exam is normal  Skin:    General: Skin is  warm.     Findings: No lesion or rash.  Neurological:     Mental Status: She is alert and oriented to person, place, and time.     Motor: No weakness.     Gait: Gait normal.  Psychiatric:        Mood and Affect: Mood normal.        Behavior: Behavior normal.     No results found for this or any previous visit (from the past 24 hour(s)).  Assessment and Plan :   PDMP not reviewed this encounter.  1. Paresthesia of bilateral legs   2. Environmental allergies    XR back negative No red flags No acute distress this evening  Pt mostly concerned about etiology. Consider underlying metabolic causes - will check labs as this has not been done in some time. I will call with results. Possibly meralgia paresthetica, may need PT/ ortho / sports med follow-up. She does not have PCP, can start with follow-up there and refer out prn. PCP recommendations provided.  Recheck prn if acutely worse or any changes.       AllwardtRanda Evens, PA-C 12/28/21 1919
